# Patient Record
Sex: Male | Born: 1950 | Race: White | Hispanic: No | Smoking: Never smoker
Health system: Southern US, Community
[De-identification: ages and names within clinical notes are randomized; demographics above are authoritative.]

## PROBLEM LIST (undated history)

## (undated) DIAGNOSIS — F329 Major depressive disorder, single episode, unspecified: Secondary | ICD-10-CM

## (undated) DIAGNOSIS — I4819 Other persistent atrial fibrillation: Secondary | ICD-10-CM

## (undated) DIAGNOSIS — R972 Elevated prostate specific antigen [PSA]: Secondary | ICD-10-CM

## (undated) DIAGNOSIS — N529 Male erectile dysfunction, unspecified: Secondary | ICD-10-CM

## (undated) DIAGNOSIS — K219 Gastro-esophageal reflux disease without esophagitis: Secondary | ICD-10-CM

## (undated) DIAGNOSIS — M1990 Unspecified osteoarthritis, unspecified site: Secondary | ICD-10-CM

## (undated) DIAGNOSIS — J4520 Mild intermittent asthma, uncomplicated: Secondary | ICD-10-CM

## (undated) DIAGNOSIS — I10 Essential (primary) hypertension: Secondary | ICD-10-CM

## (undated) DIAGNOSIS — E785 Hyperlipidemia, unspecified: Secondary | ICD-10-CM

## (undated) DIAGNOSIS — R0602 Shortness of breath: Secondary | ICD-10-CM

## (undated) DIAGNOSIS — F419 Anxiety disorder, unspecified: Secondary | ICD-10-CM

## (undated) DIAGNOSIS — F32A Depression, unspecified: Secondary | ICD-10-CM

## (undated) DIAGNOSIS — R7303 Prediabetes: Secondary | ICD-10-CM

## (undated) HISTORY — DX: Prediabetes: R73.03

## (undated) HISTORY — DX: Hyperlipidemia, unspecified: E78.5

## (undated) HISTORY — DX: Mild intermittent asthma, uncomplicated: J45.20

## (undated) HISTORY — PX: TONSILLECTOMY: SUR1361

## (undated) HISTORY — DX: Elevated prostate specific antigen (PSA): R97.20

## (undated) HISTORY — PX: HERNIA REPAIR: SHX51

## (undated) HISTORY — DX: Male erectile dysfunction, unspecified: N52.9

## (undated) HISTORY — DX: Essential (primary) hypertension: I10

## (undated) HISTORY — DX: Other persistent atrial fibrillation: I48.19

## (undated) HISTORY — DX: Anxiety disorder, unspecified: F41.9

---

## 2007-11-08 ENCOUNTER — Encounter (INDEPENDENT_AMBULATORY_CARE_PROVIDER_SITE_OTHER): Payer: Self-pay | Admitting: *Deleted

## 2007-11-08 ENCOUNTER — Ambulatory Visit (HOSPITAL_COMMUNITY): Admission: RE | Admit: 2007-11-08 | Discharge: 2007-11-08 | Payer: Self-pay | Admitting: *Deleted

## 2010-05-31 ENCOUNTER — Other Ambulatory Visit: Payer: Self-pay | Admitting: Gastroenterology

## 2010-07-23 NOTE — Op Note (Signed)
Greg Huff, Greg Huff               ACCOUNT NO.:  [id number]   MEDICAL RECORD NO.:  [id number]          PATIENT TYPE:  AMB   LOCATION:  ENDO                         FACILITY:  [facility]   PHYSICIAN:  Georgiana Spinner, M.D.    DATE OF BIRTH:  March 26, 1950   DATE OF PROCEDURE:  DATE OF DISCHARGE:                               OPERATIVE REPORT   PROCEDURE:  Colonoscopy.   INDICATIONS:  Colon cancer screening.   ANESTHESIA:  Fentanyl 45 mcg, Versed 4 mg.   PROCEDURE:  With the patient mildly sedated in the left lateral  decubitus position, a rectal exam was performed which was unremarkable.  Subsequently, the Pentax videoscopic colonoscope was inserted in the  rectum and passed under direct vision to the cecum identified by  ileocecal valve and appendiceal orifice both of which were photographed.  There was a small polyp in the cecum near the appendiceal orifice, which  was photographed and removed using snare cautery technique setting of  20/150 blended current.  The endoscope was then slowly withdrawn taking  circumferential views of the remaining colonic mucosa, stopping in the  ascending colon a few folds removed from the ileocecal valve, at which  point another small polyp was seen.  It was removed using hot biopsy  forceps technique and added to the first bottle.  The remainder of the  tissue was eradicated.  We next stopped in the descending colon  approximately 30 cm from the anal verge, at which point a third polyp  was seen.  It too was photographed and it too was removed using hot  biopsy forceps technique same setting.  In the rectum, we placed the  endoscope in retroflexed view to view the anal canal from below.  Internal hemorrhoids were seen and photographed.  The endoscope was  straightened and withdrawn.  The patient's vital signs and pulse  oximeter remained stable.  The patient tolerated the procedure well  without apparent complication.   FINDINGS:  Small polyps of the  cecum and ascending colon and at 30 cm  from anal verge along with internal hemorrhoids.  Otherwise, an  unremarkable exam.   PLAN:  Await biopsy report.  The patient will call me for results and  follow up with me as an outpatient.           ______________________________  Georgiana Spinner, M.D.     GMO/MEDQ  D:  11/08/2007  T:  11/08/2007  Job:  [id number]

## 2010-07-23 NOTE — Op Note (Signed)
NAMEANASTACIO, BUA               ACCOUNT NO.:  [id number]   MEDICAL RECORD NO.:  [id number]          PATIENT TYPE:  AMB   LOCATION:  ENDO                         FACILITY:  [facility]   PHYSICIAN:  Georgiana Spinner, M.D.    DATE OF BIRTH:  September 16, 1950   DATE OF PROCEDURE:  DATE OF DISCHARGE:                               OPERATIVE REPORT   PROCEDURE:  Upper endoscopy.   INDICATIONS:  GERD.   ANESTHESIA:  Fentanyl 75 mcg, Versed 7 mg.   PROCEDURE:  With the patient mildly sedated in the left lateral  decubitus position the Pentax videoscopic endoscope was inserted in the  mouth, passed under direct vision through the esophagus which appeared  normal until we reached the distal esophagus and there appeared to be  two small islands of Barrett's, photographed and biopsied.  We entered  into the stomach, fundus, body, antrum, duodenal bulb, second portion of  the duodenum and all appeared normal.  From this point the endoscope was  slowly withdrawn taking circumferential views of duodenal mucosa until  the endoscope had been pulled back into stomach and placed in  retroflexion to view the stomach from below.  The endoscope was  straightened and withdrawn taking circumferential views of the remaining  gastric and esophageal mucosa.  The patient's vital signs and pulse  oximeter remained stable.  The patient tolerated the procedure well  without apparent complications.   FINDINGS:  Two small islands of Barrett's esophagus biopsied.  Await  biopsy report.  The patient will call me for results and follow-up with  me as an outpatient.  Proceed to colonoscopy as planned.           ______________________________  Georgiana Spinner, M.D.     GMO/MEDQ  D:  11/08/2007  T:  11/08/2007  Job:  [id number]

## 2012-03-12 ENCOUNTER — Other Ambulatory Visit: Payer: Self-pay | Admitting: Orthopedic Surgery

## 2012-03-12 MED ORDER — DEXAMETHASONE SODIUM PHOSPHATE 10 MG/ML IJ SOLN: 10.0000 mg | Freq: Once | INTRAMUSCULAR | Status: DC

## 2012-03-12 MED ORDER — BUPIVACAINE LIPOSOME 1.3 % IJ SUSP: 20.0000 mL | Freq: Once | INTRAMUSCULAR | Status: DC

## 2012-03-12 NOTE — Progress Notes (Signed)
Preoperative surgical orders have been place into the Epic hospital system for Greg Huff on 03/12/2012, 1:53 PM  by Patrica Duel for surgery on 04/21/2012.  Preop Total Hip - Anterior Approach orders including Experel Injecion, IV Tylenol, and IV Decadron as long as there are no contraindications to the above medications. Avel Peace, PA-C

## 2012-04-13 NOTE — Patient Instructions (Signed)
Greg Huff  04/13/2012   Your procedure is scheduled on:  04/21/12   Report to [id number]  AM.  Call this number if you have problems the morning of surgery: [phone]   Remember:   Do not eat food or drink liquids after midnight.   Take these medicines the morning of surgery with A SIP OF WATER:    Do not wear jewelry,   Do not wear lotions, powders, or perfumes.   . Men may shave face and neck.  Do not bring valuables to the hospital.  Contacts, dentures or bridgework may not be worn into surgery.  Leave suitcase in the car. After surgery it may be brought to your room.  For patients admitted to the hospital, checkout time is 11:00 AM the day of  discharge.        SEE CHG INSTRUCTION SHEET    Please read over the following fact sheets that you were given: MRSA Information, coughing and deep breathing exercises, leg exercises, Blood Transfusion Fact sheet, Incentive Spirometry Fact sheet                Failure to comply with these instructions may result in cancellation of your surgery.                Patient Signature ____________________________              Nurse Signature _____________________________

## 2012-04-14 ENCOUNTER — Ambulatory Visit (HOSPITAL_COMMUNITY)
Admission: RE | Admit: 2012-04-14 | Discharge: 2012-04-14 | Disposition: A | Discharge status: Home / Self Care | Payer: BC Managed Care – PPO | Source: Ambulatory Visit | Attending: Orthopedic Surgery | Admitting: Orthopedic Surgery

## 2012-04-14 ENCOUNTER — Encounter (HOSPITAL_COMMUNITY)
Admission: RE | Admit: 2012-04-14 | Discharge: 2012-04-14 | Disposition: A | Discharge status: Home / Self Care | Payer: BC Managed Care – PPO | Source: Ambulatory Visit | Attending: Orthopedic Surgery | Admitting: Orthopedic Surgery

## 2012-04-14 ENCOUNTER — Encounter (HOSPITAL_COMMUNITY): Payer: Self-pay

## 2012-04-14 DIAGNOSIS — M169 Osteoarthritis of hip, unspecified: Secondary | ICD-10-CM | POA: Insufficient documentation

## 2012-04-14 DIAGNOSIS — M1610 Unilateral primary osteoarthritis, unspecified hip: Secondary | ICD-10-CM | POA: Insufficient documentation

## 2012-04-14 DIAGNOSIS — Z01812 Encounter for preprocedural laboratory examination: Secondary | ICD-10-CM | POA: Insufficient documentation

## 2012-04-14 DIAGNOSIS — Z01818 Encounter for other preprocedural examination: Secondary | ICD-10-CM | POA: Insufficient documentation

## 2012-04-14 HISTORY — DX: Major depressive disorder, single episode, unspecified: F32.9

## 2012-04-14 HISTORY — DX: Shortness of breath: R06.02

## 2012-04-14 HISTORY — DX: Unspecified osteoarthritis, unspecified site: M19.90

## 2012-04-14 HISTORY — DX: Depression, unspecified: F32.A

## 2012-04-14 HISTORY — DX: Gastro-esophageal reflux disease without esophagitis: K21.9

## 2012-04-14 LAB — URINALYSIS, ROUTINE W REFLEX MICROSCOPIC
Glucose, UA: NEGATIVE mg/dL
Leukocytes, UA: NEGATIVE
Nitrite: NEGATIVE
Specific Gravity, Urine: 1.022 (ref 1.005–1.030)
pH: 5.5 (ref 5.0–8.0)

## 2012-04-14 LAB — CBC
MCH: 32.5 pg (ref 26.0–34.0)
MCV: 93.5 fL (ref 78.0–100.0)
Platelets: 238 10*3/uL (ref 150–400)
RBC: 4.31 MIL/uL (ref 4.22–5.81)
RDW: 12.7 % (ref 11.5–15.5)
WBC: 7.2 10*3/uL (ref 4.0–10.5)

## 2012-04-14 LAB — COMPREHENSIVE METABOLIC PANEL
ALT: 28 U/L (ref 0–53)
AST: 25 U/L (ref 0–37)
Albumin: 3.8 g/dL (ref 3.5–5.2)
Calcium: 9.2 mg/dL (ref 8.4–10.5)
Chloride: 106 mEq/L (ref 96–112)
Creatinine, Ser: 0.73 mg/dL (ref 0.50–1.35)
Sodium: 141 mEq/L (ref 135–145)

## 2012-04-14 LAB — SURGICAL PCR SCREEN
MRSA, PCR: NEGATIVE
Staphylococcus aureus: NEGATIVE

## 2012-04-14 LAB — PROTIME-INR: INR: 1.27 (ref 0.00–1.49)

## 2012-04-14 NOTE — Progress Notes (Signed)
Urinalysis results faxed via EPIC to Dr Lequita Halt.

## 2012-04-14 NOTE — Progress Notes (Signed)
Your patient has screened at an elevated risk for Obstructive Sleep Apnea using the STOP-Bang Tool during a presurgical visit.  A score of 4 or greater is considered an elevated risk.   

## 2012-04-14 NOTE — Progress Notes (Signed)
04/14/12 0919  OBSTRUCTIVE SLEEP APNEA  Have you ever been diagnosed with sleep apnea through a sleep study? No  Do you snore loudly (loud enough to be heard through closed doors)?  0  Do you often feel tired, fatigued, or sleepy during the daytime? 1  Has anyone observed you stop breathing during your sleep? 0  Do you have, or are you being treated for high blood pressure? 1  BMI more than 35 kg/m2? 0  Age over 60 years old? 1  Neck circumference greater than 40 cm/18 inches? 0  Gender: 1  Obstructive Sleep Apnea Score 4

## 2012-04-14 NOTE — Progress Notes (Signed)
Last office visit note with Dr Herbie Baltimore ( cardiology ) 12/31/11 on chart  EKG 12/31/11 on chart Stress Test 07/29/11 on chart  ECHO 06/02/11 on chart  CPET- 12/22/11 on chart  PFT- 12/22/11 on chart

## 2012-04-18 ENCOUNTER — Other Ambulatory Visit: Payer: Self-pay | Admitting: Orthopedic Surgery

## 2012-04-18 NOTE — H&P (Signed)
Greg Huff  DOB: 06/15/1950 Divorced / Language: English / Race: White Male  Date of Admission:  04/21/2012  Chief Complaint:  Left Hip Pain  History of Present Illness The patient is a 61 year old male who comes in for a preoperative History and Physical. The patient is scheduled for a left to be performed by Dr. Frank V. Aluisio, MD at [facility] on 04/21/2012. The patient is a 61 year old male who presents with a hip problem. The patient reports left hip and right hip problems including pain symptoms that have been present for 1 year(s). The symptoms began without any known injury.The patient feels as if their symptoms are does feel they are worsening. Prior to being seen today the patient was previously evaluated in this clinic by Dr. Gioffre 6 months ago. Previous workup for this problem has included hip x-rays. He reports the left hip is much worse than the right. He has pain in the groin and into the anterior thigh. No numbness or tingling. The right hip does not really bother him at all. He was told that he had some arthritic changes in his right hip but he does not notice it. The left hip has been bothering him for about a year. Dr. Gioffre recommended Celebrex if cleared by his cardiologist but it was not. He does take Tramadol but does not notice a big difference. He has had two intra-articular injections by Dr. Beekman. These last him about 2 months. His last one was two months ago. He is ready to discuss total hip. Unfortunately, his hip is getting progressively worse over time. He has pain at most times and with almost all activities. He used to be a runner but stopped doing that a few years ago. He even has pain with just walking now. He can sleep at night but does take awhile to get in a comfortable position. He's having increased functional limitations due to the hip.  He is ready to proceed with hip surgery. They have been treated conservatively in the past  for the above stated problem and despite conservative measures, they continue to have progressive pain and severe functional limitations and dysfunction. They have failed non-operative management including home exercise, medications. It is felt that they would benefit from undergoing total joint replacement. Risks and benefits of the procedure have been discussed with the patient and they elect to proceed with surgery. There are no active contraindications to surgery such as ongoing infection or rapidly progressive neurological disease.   Problem List Osteoarthritis, hip (715.35)   Allergies No Known Drug Allergies   Family History Cerebrovascular Accident. mother Congestive Heart Failure. grandmother mothers side Heart disease in male family member before age 65 Hypertension. mother and grandmother mothers side   Social History Living situation. live with partner Marital status. partnered Most recent primary occupation. assistant superintendent for [employer] Drug[facility]cohol [facility] (Currently). no Exercise. Exercises daily; does running / walking and gym / weights Illicit drug use. no Number of flights of stairs before winded. greater than 5 Tobacco use. never smoker Pain Contract. no Previously in rehab. no Tobacco / smoke exposure. no Current work status. working full time Alcohol use. current drinker; drinks beer, wine and hard liquor; 8-14 per week Children. 2 Post-Surgical Plans. Plan is to go home. Advance Directives. None   Medication History Omeprazole (20MG Tablet DR, Oral) Active. AndroGel Pump (20.25 MG/ACT(1.62%) Gel, Transdermal) Active. Vitamin B Complex ( Oral) Active. Metoprolol Tartrate (25MG Tablet,   Oral) Active. Atorvastatin Calcium (20MG Tablet, Oral) Active. Sertraline HCl (50MG Tablet, Oral) Active. Xarelto (20MG Tablet, Oral) Active. Aspirin Child (81MG Tablet Chewable, Oral) Active. Lisinopril  (20MG Tablet, Oral) Active. Cialis ( Oral) Specific dose unknown - Active. TraMADol HCl (50MG Tablet, Oral) Active.   Past Surgical History Colon Polyp Removal - Colonoscopy Other Surgery. Date: 11/2005. Umbilical hernia Vasectomy   Medical History High blood pressure Gastroesophageal Reflux Disease Atrial Fibrillation Shingles Bronchitis Pneumonia Barrett's Esophagus. Past History - controlled with current medications. Eczema   Review of Systems General:Not Present- Chills, Fever, Night Sweats, Fatigue, Weight Gain, Weight Loss and Memory Loss. Skin:Not Present- Hives, Itching, Rash, Eczema and Lesions. HEENT:Not Present- Tinnitus, Headache, Double Vision, Visual Loss, Hearing Loss and Dentures. Respiratory:Not Present- Shortness of breath with exertion, Shortness of breath at rest, Allergies, Coughing up blood and Chronic Cough. Cardiovascular:Not Present- Chest Pain, Racing/skipping heartbeats, Difficulty Breathing Lying Down, Murmur, Swelling and Palpitations. Gastrointestinal:Present- Heartburn. Not Present- Bloody Stool, Abdominal Pain, Vomiting, Nausea, Constipation, Diarrhea, Difficulty Swallowing, Jaundice and Loss of appetitie. Male Genitourinary:Not Present- Urinary frequency, Blood in Urine, Weak urinary stream, Discharge, Flank Pain, Incontinence, Painful Urination, Urgency, Urinary Retention and Urinating at Night. Musculoskeletal:Present- Joint Pain. Not Present- Muscle Weakness, Muscle Pain, Joint Swelling, Back Pain, Morning Stiffness and Spasms. Neurological:Not Present- Tremor, Dizziness, Blackout spells, Paralysis, Difficulty with balance and Weakness. Psychiatric:Not Present- Insomnia.   Vitals Weight: 220 lb Height: 73 in Body Surface Area: 2.27 m Body Mass Index: 29.03 kg/m Pulse: 76 (Irregular) Resp.: 14 (Unlabored) BP: 142/88 (Sitting, Right Arm, Standard)    Physical Exam The physical exam findings are as  follows:  Note: Patient is a 61 year old male with continued hip pain.   General Mental Status - Alert, cooperative and good historian. General Appearance- pleasant. Not in acute distress. Orientation- Oriented X3. Build & Nutrition- Well nourished and Well developed.   Head and Neck Head- normocephalic, atraumatic . Neck Global Assessment- supple. no bruit auscultated on the right and no bruit auscultated on the left.   Eye Pupil- Bilateral- Regular and Round. Motion- Bilateral- EOMI.   Chest and Lung Exam Auscultation: Breath sounds:- clear at anterior chest wall and - clear at posterior chest wall. Adventitious sounds:- No Adventitious sounds.   Cardiovascular Auscultation:Rhythm- Regular rate and rhythm. Heart Sounds- S1 WNL and S2 WNL. Murmurs & Other Heart Sounds:Auscultation of the heart reveals - No Murmurs.   Abdomen Palpation/Percussion:Tenderness- Abdomen is non-tender to palpation. Rigidity (guarding)- Abdomen is soft. Auscultation:Auscultation of the abdomen reveals - Bowel sounds normal.   Male Genitourinary Not done, not pertinent to present illness  Musculoskeletal On exam, he is a well-developed male, alert and oriented, in no apparent distress. His right hip can be flexed to 110, rotated in 20, out 30, abducted 30 without discomfort. Left hip flexion 90. Minimal internal rotation, about 10 external rotation, about 30 abduction. He does have pain on rotation and on abduction.  Knee exam is normal. Pulses, sensation and motor are intact, both lower extremities. He walks with a slightly antalgic gait pattern, left leg.  RADIOGRAPHS: AP pelvis and lateral of the left hip taken today show bone on bone arthritis of the left hip. He has a shallow acetabulum with some dysplasia present.  Assessment & Plan Osteoarthritis, hip (715.35) Impression: Left Hip  Note: Patient is for a Left Total Hip Replacement by Dr.  Aluisio.  Plan is to go home.  PCP - Dr. Walter Pharr - Patient has been seen preoperatively and felt to   be stable for surgery. Cardiology - Dr. Harding - Patient has been seen preoperatively and felt to be stable for surgery. He is instructed to stop his ASA 5 days preop and his Xarelto 3 dayas preop. We will resume both medications postoperatively.  Signed electronically by DREW L Adyn Serna, PA-C  

## 2012-04-19 ENCOUNTER — Encounter (HOSPITAL_COMMUNITY): Payer: Self-pay | Admitting: Pharmacy Technician

## 2012-04-21 ENCOUNTER — Ambulatory Visit (HOSPITAL_COMMUNITY): Payer: BC Managed Care – PPO | Admitting: Anesthesiology

## 2012-04-21 ENCOUNTER — Ambulatory Visit (HOSPITAL_COMMUNITY): Payer: BC Managed Care – PPO

## 2012-04-21 ENCOUNTER — Inpatient Hospital Stay (HOSPITAL_COMMUNITY)
Admission: RE | Admit: 2012-04-21 | Discharge: 2012-04-23 | DRG: 818 | Disposition: A | Discharge status: Home Health | Payer: BC Managed Care – PPO | Source: Ambulatory Visit | Attending: Orthopedic Surgery | Admitting: Orthopedic Surgery

## 2012-04-21 ENCOUNTER — Encounter (HOSPITAL_COMMUNITY)
Admission: RE | Disposition: A | Discharge status: Home Health | Payer: Self-pay | Source: Ambulatory Visit | Attending: Orthopedic Surgery

## 2012-04-21 ENCOUNTER — Encounter (HOSPITAL_COMMUNITY): Payer: Self-pay | Admitting: Anesthesiology

## 2012-04-21 ENCOUNTER — Encounter (HOSPITAL_COMMUNITY): Payer: Self-pay | Admitting: *Deleted

## 2012-04-21 DIAGNOSIS — Z96649 Presence of unspecified artificial hip joint: Secondary | ICD-10-CM

## 2012-04-21 DIAGNOSIS — I4891 Unspecified atrial fibrillation: Secondary | ICD-10-CM | POA: Diagnosis present

## 2012-04-21 DIAGNOSIS — M169 Osteoarthritis of hip, unspecified: Secondary | ICD-10-CM | POA: Diagnosis present

## 2012-04-21 DIAGNOSIS — M1610 Unilateral primary osteoarthritis, unspecified hip: Principal | ICD-10-CM | POA: Diagnosis present

## 2012-04-21 DIAGNOSIS — I10 Essential (primary) hypertension: Secondary | ICD-10-CM | POA: Diagnosis present

## 2012-04-21 DIAGNOSIS — K219 Gastro-esophageal reflux disease without esophagitis: Secondary | ICD-10-CM | POA: Diagnosis present

## 2012-04-21 DIAGNOSIS — E669 Obesity, unspecified: Secondary | ICD-10-CM | POA: Diagnosis present

## 2012-04-21 HISTORY — PX: TOTAL HIP ARTHROPLASTY: SHX124

## 2012-04-21 LAB — ABO/RH: ABO/RH(D): B POS

## 2012-04-21 LAB — TYPE AND SCREEN: Antibody Screen: NEGATIVE

## 2012-04-21 SURGERY — ARTHROPLASTY, HIP, TOTAL, ANTERIOR APPROACH
Anesthesia: General | Site: Hip | Laterality: Left | Wound class: Clean

## 2012-04-21 MED ORDER — DEXAMETHASONE SODIUM PHOSPHATE 10 MG/ML IJ SOLN: 10.0000 mg | Freq: Once | INTRAMUSCULAR | Status: AC

## 2012-04-21 MED ORDER — OXYCODONE HCL 5 MG PO TABS
5.0000 mg | ORAL_TABLET | ORAL | Status: DC | PRN
  Administered 2012-04-21 – 2012-04-23 (×12): 10 mg via ORAL
  Filled 2012-04-21 (×12): qty 2

## 2012-04-21 MED ORDER — METOPROLOL TARTRATE 25 MG PO TABS
25.0000 mg | ORAL_TABLET | Freq: Two times a day (BID) | ORAL | Status: DC
  Administered 2012-04-22 – 2012-04-23 (×4): 25 mg via ORAL
  Filled 2012-04-21 (×5): qty 1

## 2012-04-21 MED ORDER — PANTOPRAZOLE SODIUM 40 MG PO TBEC
40.0000 mg | DELAYED_RELEASE_TABLET | Freq: Every day | ORAL | Status: DC
  Filled 2012-04-21: qty 1

## 2012-04-21 MED ORDER — PROPOFOL 10 MG/ML IV EMUL
INTRAVENOUS | Status: DC | PRN
  Administered 2012-04-21: 100 ug/kg/min via INTRAVENOUS

## 2012-04-21 MED ORDER — SODIUM CHLORIDE 0.9 % IJ SOLN
INTRAMUSCULAR | Status: DC | PRN
  Administered 2012-04-21: 50 mL via INTRAVENOUS

## 2012-04-21 MED ORDER — CEFAZOLIN SODIUM-DEXTROSE 2-3 GM-% IV SOLR
2.0000 g | Freq: Four times a day (QID) | INTRAVENOUS | Status: AC
  Administered 2012-04-21 (×2): 2 g via INTRAVENOUS
  Filled 2012-04-21 (×2): qty 50

## 2012-04-21 MED ORDER — ACETAMINOPHEN 10 MG/ML IV SOLN
INTRAVENOUS | Status: AC
  Filled 2012-04-21: qty 100

## 2012-04-21 MED ORDER — ACETAMINOPHEN 650 MG RE SUPP: 650.0000 mg | Freq: Four times a day (QID) | RECTAL | Status: DC | PRN

## 2012-04-21 MED ORDER — ONDANSETRON HCL 4 MG/2ML IJ SOLN
INTRAMUSCULAR | Status: DC | PRN
  Administered 2012-04-21: 4 mg via INTRAVENOUS

## 2012-04-21 MED ORDER — CEFAZOLIN SODIUM-DEXTROSE 2-3 GM-% IV SOLR
INTRAVENOUS | Status: AC
  Filled 2012-04-21: qty 50

## 2012-04-21 MED ORDER — POLYETHYLENE GLYCOL 3350 17 G PO PACK: 17.0000 g | PACK | Freq: Every day | ORAL | Status: DC | PRN

## 2012-04-21 MED ORDER — LACTATED RINGERS IV SOLN: INTRAVENOUS | Status: DC

## 2012-04-21 MED ORDER — LACTATED RINGERS IV SOLN
INTRAVENOUS | Status: DC | PRN
  Administered 2012-04-21 (×3): via INTRAVENOUS

## 2012-04-21 MED ORDER — ACETAMINOPHEN 10 MG/ML IV SOLN
1000.0000 mg | Freq: Once | INTRAVENOUS | Status: AC
  Administered 2012-04-21: 1000 mg via INTRAVENOUS

## 2012-04-21 MED ORDER — SODIUM CHLORIDE 0.9 % IV SOLN: INTRAVENOUS | Status: DC

## 2012-04-21 MED ORDER — ONDANSETRON HCL 4 MG/2ML IJ SOLN: 4.0000 mg | Freq: Four times a day (QID) | INTRAMUSCULAR | Status: DC | PRN

## 2012-04-21 MED ORDER — METHOCARBAMOL 100 MG/ML IJ SOLN
500.0000 mg | Freq: Four times a day (QID) | INTRAVENOUS | Status: DC | PRN
  Administered 2012-04-21: 500 mg via INTRAVENOUS
  Filled 2012-04-21: qty 5

## 2012-04-21 MED ORDER — ONDANSETRON HCL 4 MG PO TABS: 4.0000 mg | ORAL_TABLET | Freq: Four times a day (QID) | ORAL | Status: DC | PRN

## 2012-04-21 MED ORDER — METOCLOPRAMIDE HCL 10 MG PO TABS: 5.0000 mg | ORAL_TABLET | Freq: Three times a day (TID) | ORAL | Status: DC | PRN

## 2012-04-21 MED ORDER — BUPIVACAINE LIPOSOME 1.3 % IJ SUSP
20.0000 mL | Freq: Once | INTRAMUSCULAR | Status: AC
  Administered 2012-04-21: 20 mL
  Filled 2012-04-21: qty 20

## 2012-04-21 MED ORDER — STERILE WATER FOR IRRIGATION IR SOLN
Status: DC | PRN
  Administered 2012-04-21: 3000 mL

## 2012-04-21 MED ORDER — DEXTROSE-NACL 5-0.9 % IV SOLN
INTRAVENOUS | Status: DC
  Administered 2012-04-21 – 2012-04-22 (×2): via INTRAVENOUS

## 2012-04-21 MED ORDER — EPHEDRINE SULFATE 50 MG/ML IJ SOLN
INTRAMUSCULAR | Status: DC | PRN
  Administered 2012-04-21: 10 mg via INTRAVENOUS
  Administered 2012-04-21 (×3): 5 mg via INTRAVENOUS

## 2012-04-21 MED ORDER — BISACODYL 10 MG RE SUPP: 10.0000 mg | Freq: Every day | RECTAL | Status: DC | PRN

## 2012-04-21 MED ORDER — DOCUSATE SODIUM 100 MG PO CAPS
100.0000 mg | ORAL_CAPSULE | Freq: Two times a day (BID) | ORAL | Status: DC
  Administered 2012-04-22 – 2012-04-23 (×4): 100 mg via ORAL

## 2012-04-21 MED ORDER — METOCLOPRAMIDE HCL 5 MG/ML IJ SOLN: 5.0000 mg | Freq: Three times a day (TID) | INTRAMUSCULAR | Status: DC | PRN

## 2012-04-21 MED ORDER — PHENOL 1.4 % MT LIQD: 1.0000 | OROMUCOSAL | Status: DC | PRN

## 2012-04-21 MED ORDER — RIVAROXABAN 10 MG PO TABS
10.0000 mg | ORAL_TABLET | Freq: Every day | ORAL | Status: DC
  Administered 2012-04-22 – 2012-04-23 (×2): 10 mg via ORAL
  Filled 2012-04-21 (×3): qty 1

## 2012-04-21 MED ORDER — ACETAMINOPHEN 325 MG PO TABS: 650.0000 mg | ORAL_TABLET | Freq: Four times a day (QID) | ORAL | Status: DC | PRN

## 2012-04-21 MED ORDER — HYDROMORPHONE HCL PF 1 MG/ML IJ SOLN: 0.2500 mg | INTRAMUSCULAR | Status: DC | PRN

## 2012-04-21 MED ORDER — FENTANYL CITRATE 0.05 MG/ML IJ SOLN
INTRAMUSCULAR | Status: DC | PRN
Start: 2012-04-21 — End: 2012-04-21
  Administered 2012-04-21: 100 ug via INTRAVENOUS

## 2012-04-21 MED ORDER — BUPIVACAINE HCL (PF) 0.5 % IJ SOLN
INTRAMUSCULAR | Status: AC
  Filled 2012-04-21: qty 30

## 2012-04-21 MED ORDER — ACETAMINOPHEN 10 MG/ML IV SOLN
1000.0000 mg | Freq: Four times a day (QID) | INTRAVENOUS | Status: AC
  Administered 2012-04-21 – 2012-04-22 (×4): 1000 mg via INTRAVENOUS
  Filled 2012-04-21 (×6): qty 100

## 2012-04-21 MED ORDER — MORPHINE SULFATE 2 MG/ML IJ SOLN
1.0000 mg | INTRAMUSCULAR | Status: DC | PRN
  Administered 2012-04-21: 1 mg via INTRAVENOUS
  Administered 2012-04-21: 2 mg via INTRAVENOUS
  Administered 2012-04-21: 1 mg via INTRAVENOUS
  Filled 2012-04-21 (×3): qty 1

## 2012-04-21 MED ORDER — TRAMADOL HCL 50 MG PO TABS
50.0000 mg | ORAL_TABLET | Freq: Four times a day (QID) | ORAL | Status: DC | PRN
Start: 2012-04-21 — End: 2012-04-23

## 2012-04-21 MED ORDER — ATORVASTATIN CALCIUM 20 MG PO TABS
20.0000 mg | ORAL_TABLET | Freq: Every day | ORAL | Status: DC
  Administered 2012-04-21 – 2012-04-22 (×2): 20 mg via ORAL
  Filled 2012-04-21 (×3): qty 1

## 2012-04-21 MED ORDER — FLEET ENEMA 7-19 GM/118ML RE ENEM: 1.0000 | ENEMA | Freq: Once | RECTAL | Status: AC | PRN

## 2012-04-21 MED ORDER — MIDAZOLAM HCL 5 MG/5ML IJ SOLN
INTRAMUSCULAR | Status: DC | PRN
  Administered 2012-04-21 (×2): 1 mg via INTRAVENOUS
  Administered 2012-04-21: 2 mg via INTRAVENOUS

## 2012-04-21 MED ORDER — PROMETHAZINE HCL 25 MG/ML IJ SOLN: 6.2500 mg | INTRAMUSCULAR | Status: DC | PRN

## 2012-04-21 MED ORDER — 0.9 % SODIUM CHLORIDE (POUR BTL) OPTIME
TOPICAL | Status: DC | PRN
  Administered 2012-04-21: 1000 mL

## 2012-04-21 MED ORDER — CEFAZOLIN SODIUM-DEXTROSE 2-3 GM-% IV SOLR
2.0000 g | INTRAVENOUS | Status: AC
  Administered 2012-04-21: 2 g via INTRAVENOUS

## 2012-04-21 MED ORDER — CHLORHEXIDINE GLUCONATE 4 % EX LIQD
60.0000 mL | Freq: Once | CUTANEOUS | Status: DC
  Filled 2012-04-21: qty 60

## 2012-04-21 MED ORDER — DEXAMETHASONE 6 MG PO TABS
10.0000 mg | ORAL_TABLET | Freq: Once | ORAL | Status: AC
  Administered 2012-04-22: 10 mg via ORAL
  Filled 2012-04-21: qty 1

## 2012-04-21 MED ORDER — DIPHENHYDRAMINE HCL 12.5 MG/5ML PO ELIX
12.5000 mg | ORAL_SOLUTION | ORAL | Status: DC | PRN
  Administered 2012-04-22 (×3): 12.5 mg via ORAL
  Administered 2012-04-23: 25 mg via ORAL
  Filled 2012-04-21 (×2): qty 5
  Filled 2012-04-21 (×2): qty 10

## 2012-04-21 MED ORDER — METHOCARBAMOL 500 MG PO TABS
500.0000 mg | ORAL_TABLET | Freq: Four times a day (QID) | ORAL | Status: DC | PRN
  Administered 2012-04-22 – 2012-04-23 (×3): 500 mg via ORAL
  Filled 2012-04-21 (×3): qty 1

## 2012-04-21 MED ORDER — MENTHOL 3 MG MT LOZG: 1.0000 | LOZENGE | OROMUCOSAL | Status: DC | PRN

## 2012-04-21 MED ORDER — SERTRALINE HCL 50 MG PO TABS
50.0000 mg | ORAL_TABLET | Freq: Every morning | ORAL | Status: DC
  Administered 2012-04-21 – 2012-04-23 (×3): 50 mg via ORAL
  Filled 2012-04-21 (×3): qty 1

## 2012-04-21 SURGICAL SUPPLY — 42 items
BAG ZIPLOCK 12X15 (MISCELLANEOUS) ×4 IMPLANT
BLADE SAW SGTL 18X1.27X75 (BLADE) ×2 IMPLANT
CLOTH BEACON ORANGE TIMEOUT ST (SAFETY) ×2 IMPLANT
CLSR STERI-STRIP ANTIMIC 1/2X4 (GAUZE/BANDAGES/DRESSINGS) ×4 IMPLANT
DECANTER SPIKE VIAL GLASS SM (MISCELLANEOUS) ×2 IMPLANT
DRAPE C-ARM 42X72 X-RAY (DRAPES) ×2 IMPLANT
DRAPE STERI IOBAN 125X83 (DRAPES) ×2 IMPLANT
DRAPE U-SHAPE 47X51 STRL (DRAPES) ×6 IMPLANT
DRSG ADAPTIC 3X8 NADH LF (GAUZE/BANDAGES/DRESSINGS) ×2 IMPLANT
DRSG MEPILEX BORDER 4X4 (GAUZE/BANDAGES/DRESSINGS) ×4 IMPLANT
DRSG MEPILEX BORDER 4X8 (GAUZE/BANDAGES/DRESSINGS) ×2 IMPLANT
DURAPREP 26ML APPLICATOR (WOUND CARE) ×4 IMPLANT
ELECT BLADE 6.5 EXT (BLADE) ×2 IMPLANT
ELECT REM PT RETURN 9FT ADLT (ELECTROSURGICAL) ×2
ELECTRODE REM PT RTRN 9FT ADLT (ELECTROSURGICAL) ×1 IMPLANT
EVACUATOR 1/8 PVC DRAIN (DRAIN) ×2 IMPLANT
FACESHIELD LNG OPTICON STERILE (SAFETY) ×8 IMPLANT
GLOVE BIO SURGEON STRL SZ7.5 (GLOVE) ×2 IMPLANT
GLOVE BIO SURGEON STRL SZ8 (GLOVE) ×4 IMPLANT
GLOVE BIOGEL PI IND STRL 8 (GLOVE) ×1 IMPLANT
GLOVE BIOGEL PI INDICATOR 8 (GLOVE) ×1
GOWN BRE IMP PREV XXLGXLNG (GOWN DISPOSABLE) ×4 IMPLANT
GOWN SRG XL XLNG 56XLVL 4 (GOWN DISPOSABLE) ×1 IMPLANT
GOWN STRL NON-REIN LRG LVL3 (GOWN DISPOSABLE) ×2 IMPLANT
GOWN STRL NON-REIN XL XLG LVL4 (GOWN DISPOSABLE) ×1
KIT BASIN OR (CUSTOM PROCEDURE TRAY) ×2 IMPLANT
NDL SAFETY ECLIPSE 18X1.5 (NEEDLE) ×1 IMPLANT
NEEDLE HYPO 18GX1.5 SHARP (NEEDLE) ×1
PACK TOTAL JOINT (CUSTOM PROCEDURE TRAY) ×2 IMPLANT
PADDING CAST COTTON 6X4 STRL (CAST SUPPLIES) ×2 IMPLANT
SPONGE GAUZE 4X4 12PLY (GAUZE/BANDAGES/DRESSINGS) ×2 IMPLANT
SUCTION FRAZIER 12FR DISP (SUCTIONS) ×2 IMPLANT
SUT ETHIBOND NAB CT1 #1 30IN (SUTURE) ×6 IMPLANT
SUT MNCRL AB 4-0 PS2 18 (SUTURE) ×2 IMPLANT
SUT VIC AB 1 CT1 27 (SUTURE) ×1
SUT VIC AB 1 CT1 27XBRD ANTBC (SUTURE) ×1 IMPLANT
SUT VIC AB 2-0 CT1 27 (SUTURE) ×3
SUT VIC AB 2-0 CT1 TAPERPNT 27 (SUTURE) ×3 IMPLANT
SUT VLOC 180 0 24IN GS25 (SUTURE) ×2 IMPLANT
SYR 50ML LL SCALE MARK (SYRINGE) ×2 IMPLANT
TOWEL OR 17X26 10 PK STRL BLUE (TOWEL DISPOSABLE) ×4 IMPLANT
TRAY FOLEY CATH 14FRSI W/METER (CATHETERS) ×2 IMPLANT

## 2012-04-21 NOTE — Anesthesia Postprocedure Evaluation (Signed)
  Anesthesia Post-op Note  Patient: Greg Huff  Procedure(s) Performed: Procedure(s) (LRB): TOTAL HIP ARTHROPLASTY ANTERIOR APPROACH (Left)  Patient Location: PACU  Anesthesia Type: Spinal  Level of Consciousness: awake and alert   Airway and Oxygen Therapy: Patient Spontanous Breathing  Post-op Pain: mild  Post-op Assessment: Post-op Vital signs reviewed, Patient's Cardiovascular Status Stable, Respiratory Function Stable, Patent Airway and No signs of Nausea or vomiting  Last Vitals:  Filed Vitals:   04/21/12 1130  BP: 136/88  Pulse: 70  Temp: 36.3 C  Resp: 12    Post-op Vital Signs: stable   Complications: No apparent anesthesia complications. Moving feet bilaterally. No back pain. Feels fine. Xarelto tomorrow morning.

## 2012-04-21 NOTE — Op Note (Signed)
OPERATIVE REPORT  PREOPERATIVE DIAGNOSIS: Osteoarthritis of the Left hip.   POSTOPERATIVE DIAGNOSIS: Osteoarthritis of the Left  hip.   PROCEDURE: Left total hip arthroplasty, anterior approach.   SURGEON: Ollen Gross, MD   ASSISTANT: Avel Peace, PA-C  ANESTHESIA:  Spinal  ESTIMATED BLOOD LOSS:-200 ml   DRAINS: Hemovac x1.   COMPLICATIONS: None   CONDITION: PACU - hemodynamically stable.   BRIEF CLINICAL NOTE: Greg Huff is a 62 y.o. male who has advanced end-  stage arthritis of his Left  hip with progressively worsening pain and  dysfunction.The patient has failed nonoperative management and presents for  total hip arthroplasty.   PROCEDURE IN DETAIL: After successful administration of spinal  anesthetic, the traction boots for the [facility] bed were placed on both  feet and the patient was placed onto the [facility] bed, boots placed into the leg  holders. The Left hip was then isolated from the perineum with plastic  drapes and prepped and draped in the usual sterile fashion. ASIS and  greater trochanter were marked and a oblique incision was made, starting  at about 1 cm lateral and 2 cm distal to the ASIS and coursing towards  the anterior cortex of the femur. The skin was cut with a 10 blade  through subcutaneous tissue to the level of the fascia overlying the  tensor fascia lata muscle. The fascia was then incised in line with the  incision at the junction of the anterior third and posterior 2/3rd. The  muscle was teased off the fascia and then the interval between the TFL  and the rectus was developed. The Hohmann retractor was then placed at  the top of the femoral neck over the capsule. The vessels overlying the  capsule were cauterized and the fat on top of the capsule was removed.  A Hohmann retractor was then placed anterior underneath the rectus  femoris to give exposure to the entire anterior capsule. A T-shaped  capsulotomy was performed. The  edges were tagged and the femoral head  was identified.       Osteophytes are removed off the superior acetabulum.  The femoral neck was then cut in situ with an oscillating saw. Traction  was then applied to the left lower extremity utilizing the [facility]  traction. The femoral head was then removed. Retractors were placed  around the acetabulum and then circumferential removal of the labrum was  performed. Osteophytes were also removed. Reaming starts at 47 mm to  medialize and  Increased in 2 mm increments to 55 mm. We reamed in  approximately 40 degrees of abduction, 20 degrees anteversion. A 56 mm  pinnacle acetabular shell was then impacted in anatomic position under  fluoroscopic guidance with excellent purchase. We did not need to place  any additional dome screws. A 36 mm neutral + 4 marathon liner was then  placed into the acetabular shell.       The femoral lift was then placed along the lateral aspect of the femur  just distal to the vastus ridge. The leg was  externally rotated and capsule  was stripped off the inferior aspect of the femoral neck down to the  level of the lesser trochanter, this was done with electrocautery. The femur was lifted after this was performed. The  leg was then placed and extended in adducted position to essentially delivering the femur. We also removed the capsule superiorly and the  piriformis from the piriformis  fossa to gain excellent exposure of the  proximal femur. Rongeur was used to remove some cancellous bone to get  into the lateral portion of the proximal femur for placement of the  initial starter reamer. The starter broaches was placed  the starter broach  and was shown to go down the center of the canal. Broaching  with the  Corail system was then performed starting at size 8, coursing  Up to size 12. A size 12 had excellent torsional and rotational  and axial stability. The trial standard offset neck was then placed  with a 36 + 1 trial  head. The hip was then reduced. We confirmed that  the stem was in the canal both on AP and lateral x-rays. It also has excellent sizing. The hip was reduced with outstanding stability through full extension, full external rotation,  and then flexion in adduction internal rotation. AP pelvis was taken  and the leg lengths were measured and found to be exactly equal. Hip  was then dislocated again and the femoral head and neck removed. The  femoral broach was removed. Size 12 Corail stem with a standard offset  neck was then impacted into the femur following native anteversion. Has  excellent purchase in the canal. Excellent torsional and rotational and  axial stability. It is confirmed to be in the canal on AP and lateral  fluoroscopic views. The 36 + 1 ceramic head was placed and the hip  reduced with outstanding stability. Again AP pelvis was taken and it  confirmed that the leg lengths were equal. The wound was then copiously  irrigated with saline solution and the capsule reattached and repaired  with Ethibond suture.  20 mL of Exparel mixed with 50 mL of saline injected  into the capsule and into the edge of the tensor fascia lata as well as  subcutaneous tissue. The fascia overlying the tensor fascia lata was  then closed with a running #1 V-Loc. Subcu was closed with interrupted  2-0 Vicryl and subcuticular running 4-0 Monocryl. Incision was cleaned  and dried. Steri-Strips and a bulky sterile dressing applied. Hemovac  drain was hooked to suction and then he was awakened and transported to  recovery in stable condition.        Please note that a surgical assistant was a medical necessity for this procedure to perform it in a safe and expeditious manner. Assistant was necessary to provide appropriate retraction of vital neurovascular structures and to prevent femoral fracture and allow for anatomic placement of the prosthesis.  Ollen Gross, M.D.

## 2012-04-21 NOTE — H&P (View-Only) (Signed)
Greg Huff  DOB: 05/07/1950 Divorced / Language: English / Race: White Male  Date of Admission:  04/21/2012  Chief Complaint:  Left Hip Pain  History of Present Illness The patient is a 62 year old male who comes in for a preoperative History and Physical. The patient is scheduled for a left to be performed by Dr. Gus Rankin. Aluisio, MD at [facility] on 04/21/2012. The patient is a 62 year old male who presents with a hip problem. The patient reports left hip and right hip problems including pain symptoms that have been present for 1 year(s). The symptoms began without any known injury.The patient feels as if their symptoms are does feel they are worsening. Prior to being seen today the patient was previously evaluated in this clinic by Dr. Darrelyn Hillock 6 months ago. Previous workup for this problem has included hip x-rays. He reports the left hip is much worse than the right. He has pain in the groin and into the anterior thigh. No numbness or tingling. The right hip does not really bother him at all. He was told that he had some arthritic changes in his right hip but he does not notice it. The left hip has been bothering him for about a year. Dr. Darrelyn Hillock recommended Celebrex if cleared by his cardiologist but it was not. He does take Tramadol but does not notice a big difference. He has had two intra-articular injections by Dr. Dierdre Forth. These last him about 2 months. His last one was two months ago. He is ready to discuss total hip. Unfortunately, his hip is getting progressively worse over time. He has pain at most times and with almost all activities. He used to be a runner but stopped doing that a few years ago. He even has pain with just walking now. He can sleep at night but does take awhile to get in a comfortable position. He's having increased functional limitations due to the hip.  He is ready to proceed with hip surgery. They have been treated conservatively in the past  for the above stated problem and despite conservative measures, they continue to have progressive pain and severe functional limitations and dysfunction. They have failed non-operative management including home exercise, medications. It is felt that they would benefit from undergoing total joint replacement. Risks and benefits of the procedure have been discussed with the patient and they elect to proceed with surgery. There are no active contraindications to surgery such as ongoing infection or rapidly progressive neurological disease.   Problem List Osteoarthritis, hip (715.35)   Allergies No Known Drug Allergies   Family History Cerebrovascular Accident. mother Congestive Heart Failure. grandmother mothers side Heart disease in male family member before age 42 Hypertension. mother and grandmother mothers side   Social History Living situation. live with partner Marital status. 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 no Exercise. Exercises daily; does running / walking and gym / weights Illicit drug use. no Number of flights of stairs before winded. greater than 5 Tobacco use. never smoker Pain Contract. no Previously in rehab. no Tobacco / smoke exposure. no Current work status. working full time Alcohol use. current drinker; drinks beer, wine and hard liquor; 8-14 per week Children. 2 Post-Surgical Plans. Plan is to go home. Advance Directives. None   Medication History Omeprazole (20MG  Tablet DR, Oral) Active. AndroGel Pump (20.25 MG/ACT(1.62%) Gel, Transdermal) Active. Vitamin B Complex ( Oral) Active. Metoprolol Tartrate (25MG  Tablet,  Oral) Active. Atorvastatin Calcium (20MG  Tablet, Oral) Active. Sertraline HCl (50MG  Tablet, Oral) Active. Xarelto (20MG  Tablet, Oral) Active. Aspirin Child (81MG  Tablet Chewable, Oral) Active. Lisinopril  (20MG  Tablet, Oral) Active. Cialis ( Oral) Specific dose unknown - Active. TraMADol HCl (50MG  Tablet, Oral) Active.   Past Surgical History Colon Polyp Removal - Colonoscopy Other Surgery. Date: 11/2005. Umbilical hernia Vasectomy   Medical History High blood pressure Gastroesophageal Reflux Disease Atrial Fibrillation Shingles Bronchitis Pneumonia Barrett's Esophagus. Past History - controlled with current medications. Eczema   Review of Systems General:Not Present- Chills, Fever, Night Sweats, Fatigue, Weight Gain, Weight Loss and Memory Loss. Skin:Not Present- Hives, Itching, Rash, Eczema and Lesions. HEENT:Not Present- Tinnitus, Headache, Double Vision, Visual Loss, Hearing Loss and Dentures. Respiratory:Not Present- Shortness of breath with exertion, Shortness of breath at rest, Allergies, Coughing up blood and Chronic Cough. Cardiovascular:Not Present- Chest Pain, Racing/skipping heartbeats, Difficulty Breathing Lying Down, Murmur, Swelling and Palpitations. Gastrointestinal:Present- Heartburn. Not Present- Bloody Stool, Abdominal Pain, Vomiting, Nausea, Constipation, Diarrhea, Difficulty Swallowing, Jaundice and Loss of appetitie. Male Genitourinary:Not Present- Urinary frequency, Blood in Urine, Weak urinary stream, Discharge, Flank Pain, Incontinence, Painful Urination, Urgency, Urinary Retention and Urinating at Night. Musculoskeletal:Present- Joint Pain. Not Present- Muscle Weakness, Muscle Pain, Joint Swelling, Back Pain, Morning Stiffness and Spasms. Neurological:Not Present- Tremor, Dizziness, Blackout spells, Paralysis, Difficulty with balance and Weakness. Psychiatric:Not Present- Insomnia.   Vitals Weight: 220 lb Height: 73 in Body Surface Area: 2.27 m Body Mass Index: 29.03 kg/m Pulse: 76 (Irregular) Resp.: 14 (Unlabored) BP: 142/88 (Sitting, Right Arm, Standard)    Physical Exam The physical exam findings are as  follows:  Note: Patient is a 62 year old male with continued hip pain.   General Mental Status - Alert, cooperative and good historian. General Appearance- pleasant. Not in acute distress. Orientation- Oriented X3. Build & Nutrition- Well nourished and Well developed.   Head and Neck Head- normocephalic, atraumatic . Neck Global Assessment- supple. no bruit auscultated on the right and no bruit auscultated on the left.   Eye Pupil- Bilateral- Regular and Round. Motion- Bilateral- EOMI.   Chest and Lung Exam Auscultation: Breath sounds:- clear at anterior chest wall and - clear at posterior chest wall. Adventitious sounds:- No Adventitious sounds.   Cardiovascular Auscultation:Rhythm- Regular rate and rhythm. Heart Sounds- S1 WNL and S2 WNL. Murmurs & Other Heart Sounds:Auscultation of the heart reveals - No Murmurs.   Abdomen Palpation/Percussion:Tenderness- Abdomen is non-tender to palpation. Rigidity (guarding)- Abdomen is soft. Auscultation:Auscultation of the abdomen reveals - Bowel sounds normal.   Male Genitourinary Not done, not pertinent to present illness  Musculoskeletal On exam, he is a well-developed male, alert and oriented, in no apparent distress. His right hip can be flexed to 110, rotated in 20, out 30, abducted 30 without discomfort. Left hip flexion 90. Minimal internal rotation, about 10 external rotation, about 30 abduction. He does have pain on rotation and on abduction.  Knee exam is normal. Pulses, sensation and motor are intact, both lower extremities. He walks with a slightly antalgic gait pattern, left leg.  RADIOGRAPHS: AP pelvis and lateral of the left hip taken today show bone on bone arthritis of the left hip. He has a shallow acetabulum with some dysplasia present.  Assessment & Plan Osteoarthritis, hip (715.35) Impression: Left Hip  Note: Patient is for a Left Total Hip Replacement by Dr.  Lequita Halt.  Plan is to go home.  PCP - Dr. Merri Brunette - Patient has been seen preoperatively and felt to  be stable for surgery. Cardiology - Dr. Herbie Baltimore - Patient has been seen preoperatively and felt to be stable for surgery. He is instructed to stop his ASA 5 days preop and his Xarelto 3 dayas preop. We will resume both medications postoperatively.  Signed electronically by Roberts Gaudy, PA-C

## 2012-04-21 NOTE — Progress Notes (Signed)
X-ray results noted 

## 2012-04-21 NOTE — Plan of Care (Signed)
Problem: Consults Goal: Diagnosis- Total Joint Replacement Left anterior hip     

## 2012-04-21 NOTE — Anesthesia Preprocedure Evaluation (Addendum)
Anesthesia Evaluation  Patient identified by MRN, date of birth, ID band Patient awake    Reviewed: Allergy & Precautions, H&P , NPO status , Patient's Chart, lab work & pertinent test results  Airway Mallampati: II TM Distance: >3 FB Neck ROM: Full    Dental no notable dental hx.    Pulmonary shortness of breath,  breath sounds clear to auscultation  Pulmonary exam normal       Cardiovascular hypertension, Pt. on medications and Pt. on home beta blockers + dysrhythmias Atrial Fibrillation Rhythm:Regular Rate:Normal     Neuro/Psych negative neurological ROS  negative psych ROS   GI/Hepatic Neg liver ROS, GERD-  Medicated,  Endo/Other  negative endocrine ROS  Renal/GU negative Renal ROS  negative genitourinary   Musculoskeletal negative musculoskeletal ROS (+)   Abdominal (+) + obese,   Peds negative pediatric ROS (+)  Hematology negative hematology ROS (+)   Anesthesia Other Findings   Reproductive/Obstetrics negative OB ROS                           Anesthesia Physical Anesthesia Plan  ASA: III  Anesthesia Plan: Spinal   Post-op Pain Management:    Induction: Intravenous  Airway Management Planned:   Additional Equipment:   Intra-op Plan:   Post-operative Plan: Extubation in OR  Informed Consent: I have reviewed the patients History and Physical, chart, labs and discussed the procedure including the risks, benefits and alternatives for the proposed anesthesia with the patient or authorized representative who has indicated his/her understanding and acceptance.   Dental advisory given  Plan Discussed with: CRNA  Anesthesia Plan Comments: (He has been off Xarelto for five days (this was mischarted as 2 days).Discussed risks/benefits of spinal including headache, backache, failure, bleeding, infection, and nerve damage. Patient consents to spinal. Questions answered. Coagulation  studies and platelet count acceptable.)       Anesthesia Quick Evaluation

## 2012-04-21 NOTE — Interval H&P Note (Signed)
History and Physical Interval Note:  04/21/2012 8:02 AM  Greg Huff  has presented today for surgery, with the diagnosis of osteoarthritis left hip   The various methods of treatment have been discussed with the patient and family. After consideration of risks, benefits and other options for treatment, the patient has consented to  Procedure(s): TOTAL HIP ARTHROPLASTY ANTERIOR APPROACH (Left) as a surgical intervention .  The patient's history has been reviewed, patient examined, no change in status, stable for surgery.  I have reviewed the patient's chart and labs.  Questions were answered to the patient's satisfaction.     Loanne Drilling

## 2012-04-21 NOTE — Evaluation (Signed)
Physical Therapy Evaluation Patient Details Name: Greg Huff MRN: [id number] DOB: [birth year] Today's Date: 04/21/2012 Time: 9629-5284 PT Time Calculation (min): 26 min  PT Assessment / Plan / Recommendation Clinical Impression  Pt s/p L THR presents with decreased L LE strength/ROM and post op pain limiting functional mobility    PT Assessment  Patient needs continued PT services    Follow Up Recommendations  Home health PT    Does the patient have the potential to tolerate intense rehabilitation      Barriers to Discharge None      Equipment Recommendations  Rolling walker with 5" wheels    Recommendations for Other Services OT consult   Frequency 7X/week    Precautions / Restrictions Precautions Precautions: Fall Restrictions Weight Bearing Restrictions: No Other Position/Activity Restrictions: WBAT   Pertinent Vitals/Pain 5/10, premed, ice provided, RN aware      Mobility  Bed Mobility Bed Mobility: Supine to Sit;Sit to Supine Supine to Sit: 4: Min assist Sit to Supine: 4: Min assist Details for Bed Mobility Assistance: cues for sequence and use of R LE to self assist Transfers Transfers: Sit to Stand;Stand to Sit Sit to Stand: 4: Min assist Stand to Sit: 4: Min assist Details for Transfer Assistance: cues for LE management and use of UEs to self assist Ambulation/Gait Ambulation/Gait Assistance: 4: Min assist Ambulation Distance (Feet): 37 Feet (and 15) Assistive device: Rolling walker Ambulation/Gait Assistance Details: cues for posture, sequence and position from RW Gait Pattern: Step-to pattern;Decreased step length - right;Decreased step length - left;Decreased stance time - left    Exercises     PT Diagnosis: Difficulty walking  PT Problem List: Decreased strength;Decreased range of motion;Decreased activity tolerance;Decreased mobility;Decreased knowledge of use of DME;Pain PT Treatment Interventions: DME instruction;Gait training;Stair  training;Functional mobility training;Therapeutic activities;Therapeutic exercise;Patient/family education   PT Goals Acute Rehab PT Goals PT Goal Formulation: With patient Time For Goal Achievement: 04/24/12 Potential to Achieve Goals: Good Pt will go Supine/Side to Sit: with supervision PT Goal: Supine/Side to Sit - Progress: Goal set today Pt will go Sit to Supine/Side: with supervision PT Goal: Sit to Supine/Side - Progress: Goal set today Pt will go Sit to Stand: with supervision PT Goal: Sit to Stand - Progress: Goal set today Pt will go Stand to Sit: with supervision PT Goal: Stand to Sit - Progress: Goal set today Pt will Ambulate: 51 - 150 feet;with supervision;with rolling walker PT Goal: Ambulate - Progress: Goal set today Pt will Go Up / Down Stairs: 1-2 stairs;with min assist;with least restrictive assistive device PT Goal: Up/Down Stairs - Progress: Goal set today  Visit Information  Last PT Received On: 04/21/12 Assistance Needed: +1    Subjective Data  Subjective: I need to use the bathroom Patient Stated Goal: Resume previous lifestyle with decreased pain   Prior Functioning  Home Living Lives With: Spouse Available Help at Discharge: Family Type of Home: House Home Access: Stairs to enter Secretary/administrator of Steps: 2 Entrance Stairs-Rails: None Home Layout: One level Home Adaptive Equipment: Straight cane Prior Function Level of Independence: Independent Able to Take Stairs?: Yes Driving: Yes Vocation: Full time employment Communication Communication: No difficulties Dominant Hand: Right    Cognition  Cognition Overall Cognitive Status: Appears within functional limits for tasks assessed/performed Arousal/Alertness: Awake/alert Orientation Level: Appears intact for tasks assessed Behavior During Session: [facility] for tasks performed    Extremity/Trunk Assessment Right Upper Extremity Assessment RUE ROM/Strength/Tone: [facility] for tasks  assessed Left Upper Extremity Assessment  LUE ROM/Strength/Tone: [facility] for tasks assessed Right Lower Extremity Assessment RLE ROM/Strength/Tone: [facility] for tasks assessed Left Lower Extremity Assessment LLE ROM/Strength/Tone: Deficits LLE ROM/Strength/Tone Deficits: hip strength 2/5 with AAROM to 80 flex   Balance    End of Session PT - End of Session Nurse Communication: Mobility status  GP     Alexie Samson 04/21/2012, 3:41 PM

## 2012-04-21 NOTE — Progress Notes (Signed)
Portable AP Pelvis and AP Left  Hip X-rays done.

## 2012-04-21 NOTE — Anesthesia Procedure Notes (Signed)
Spinal  Patient location during procedure: OR End time: 04/21/2012 8:39 AM Staffing CRNA/Resident: Enriqueta Shutter Performed by: anesthesiologist and resident/CRNA  Preanesthetic Checklist Completed: patient identified, site marked, surgical consent, pre-op evaluation, timeout performed, IV checked, risks and benefits discussed and monitors and equipment checked Spinal Block Patient position: sitting Prep: Betadine Patient monitoring: heart rate, continuous pulse ox and blood pressure Location: L2-3 Injection technique: single-shot Needle Needle type: Sprotte  Needle gauge: 24 G Needle length: 9 cm Assessment Sensory level: T6 Additional Notes Expiration date of kit checked and confirmed. Patient tolerated procedure well, without complications.

## 2012-04-21 NOTE — Progress Notes (Signed)
Dr. Council Mechanic made aware of patient's EKG readings- Atrial Fibrillation.

## 2012-04-21 NOTE — Transfer of Care (Signed)
Immediate Anesthesia Transfer of Care Note  Patient: Greg Huff  Procedure(s) Performed: Procedure(s): TOTAL HIP ARTHROPLASTY ANTERIOR APPROACH (Left)  Patient Location: PACU  Anesthesia Type:Regional  Level of Consciousness: awake, alert  and oriented  Airway & Oxygen Therapy: Patient Spontanous Breathing and Patient connected to face mask oxygen  Post-op Assessment: Report given to PACU RN and Post -op Vital signs reviewed and stable  Post vital signs: Reviewed and stable  Complications: No apparent anesthesia complications

## 2012-04-22 LAB — CBC
MCV: 97.4 fL (ref 78.0–100.0)
Platelets: 179 10*3/uL (ref 150–400)
RDW: 13.8 % (ref 11.5–15.5)
WBC: 7.7 10*3/uL (ref 4.0–10.5)

## 2012-04-22 LAB — BASIC METABOLIC PANEL
Calcium: 8.4 mg/dL (ref 8.4–10.5)
Chloride: 100 mEq/L (ref 96–112)
Creatinine, Ser: 0.75 mg/dL (ref 0.50–1.35)
GFR calc Af Amer: >90 mL/min (ref >90)
GFR calc non Af Amer: >90 mL/min (ref >90)

## 2012-04-22 MED ORDER — METHOCARBAMOL 500 MG PO TABS: 500.0000 mg | ORAL_TABLET | Freq: Four times a day (QID) | ORAL | Status: DC | PRN

## 2012-04-22 MED ORDER — NON FORMULARY: 20.0000 mg | Freq: Every morning | Status: DC

## 2012-04-22 MED ORDER — OMEPRAZOLE 20 MG PO CPDR
20.0000 mg | DELAYED_RELEASE_CAPSULE | Freq: Every day | ORAL | Status: DC
  Administered 2012-04-22 – 2012-04-23 (×2): 20 mg via ORAL
  Filled 2012-04-22 (×2): qty 1

## 2012-04-22 MED ORDER — OXYCODONE HCL 5 MG PO TABS
5.0000 mg | ORAL_TABLET | ORAL | Status: DC | PRN
Start: 2012-04-22 — End: 2014-12-28

## 2012-04-22 NOTE — Progress Notes (Signed)
Physical Therapy Treatment Patient Details Name: Greg Huff MRN: [id number] DOB: 1950/04/12 Today's Date: 04/22/2012 Time: 4098-1191 PT Time Calculation (min): 27 min  PT Assessment / Plan / Recommendation Comments on Treatment Session  POD # 1 pm session.  Assisted pt out of recliner to amb in hallway 2nd time.  Pt more able to advance L LE and amb furthur. Assisted pt back to bed and performed TE's.    Follow Up Recommendations  Home health PT     Does the patient have the potential to tolerate intense rehabilitation     Barriers to Discharge        Equipment Recommendations  Rolling walker with 5" wheels    Recommendations for Other Services OT consult  Frequency 7X/week   Plan      Precautions / Restrictions Precautions Precautions: Fall;None Precaution Comments: Direct Anterior Approach THR Restrictions Weight Bearing Restrictions: No Other Position/Activity Restrictions: WBAT   Pertinent Vitals/Pain C/o 4/10 L hip pain "soreness" ICE applied    Mobility  Bed Mobility Bed Mobility: Supine to Sit;Sitting - Scoot to [place] of Bed;Sit to Supine Supine to Sit: 4: Min assist Details for Bed Mobility Assistance: increased time and Min assist to support L LE due to increased c/o pain Transfers Transfers: Sit to Stand;Stand to Sit Sit to Stand: 4: Min guard;From bed Stand to Sit: 4: Min guard;To chair/3-in-1 Details for Transfer Assistance: 50% VC's on proper tech and hand placement Ambulation/Gait Ambulation/Gait Assistance: 4: Min assist Ambulation Distance (Feet): 27 Feet Assistive device: Rolling walker Ambulation/Gait Assistance Details: 50% VC's on proper walker to self distance and L LE placement Gait Pattern: Step-to pattern;Decreased step length - right;Decreased step length - left;Decreased stance time - left Gait velocity: decreased    Exercises Total Joint Exercises Ankle Circles/Pumps: AROM;Both;10 reps;Seated Quad Sets: AROM;Both;10  reps;Seated Gluteal Sets: AROM;Both;10 reps;Seated Heel Slides: AAROM;Left;10 reps;Supine Hip ABduction/ADduction: AAROM;Left;10 reps;Supine    PT Goals                                                      progressing    Visit Information  Last PT Received On: 04/22/12 Assistance Needed: +1    Subjective Data  Subjective: I need pain meds first Patient Stated Goal: home   Cognition       Balance     End of Session PT - End of Session Equipment Utilized During Treatment: Gait belt Activity Tolerance: Patient tolerated treatment well Patient left: with call bell/phone within reach;with family/visitor present;Other (comment);in bed (ICE to L hip)   Felecia Shelling  PTA [facility]  Acute  Rehab Pager      [phone]

## 2012-04-22 NOTE — Progress Notes (Signed)
   Subjective: 1 Day Post-Op Procedure(s) (LRB): TOTAL HIP ARTHROPLASTY ANTERIOR APPROACH (Left) Patient reports pain as mild.   Patient seen in rounds with Dr. Lequita Halt.  Wife in room. Patient is well, and has had no acute complaints or problems We will start therapy today.  Plan is to go Home after hospital stay.  Objective: Vital signs in last 24 hours: Temp:  [97.3 F (36.3 C)-98.6 F (37 C)] 98.6 F (37 C) (02/13 0426) Pulse Rate:  [60-96] 96 (02/13 0426) Resp:  [12-17] 16 (02/13 0426) BP: (109-177)/(61-106) 133/87 mmHg (02/13 0426) SpO2:  [94 %-100 %] 99 % (02/13 0426) Weight:  [100.699 kg (222 lb)-102.059 kg (225 lb)] 100.699 kg (222 lb) (02/12 1615)  Intake/Output from previous day:  Intake/Output Summary (Last 24 hours) at 04/22/12 0914 Last data filed at 04/22/12 0911  Gross per 24 hour  Intake   3510 ml  Output   4480 ml  Net   -970 ml    Intake/Output this shift: Total I/O In: -  Out: 525 [Urine:525]  Labs:  Recent Labs  04/22/12 0429  HGB 11.4*    Recent Labs  04/22/12 0429  WBC 7.7  RBC 3.51*  HCT 34.2*  PLT 179    Recent Labs  04/22/12 0429  NA 135  K 3.8  CL 100  CO2 28  BUN 6  CREATININE 0.75  GLUCOSE 115*  CALCIUM 8.4   No results found for this basename: LABPT, INR,  in the last 72 hours  EXAM General - Patient is Alert, Appropriate and Oriented Extremity - Neurovascular intact Sensation intact distally Dorsiflexion/Plantar flexion intact Dressing - dressing C/D/I Motor Function - intact, moving foot and toes well on exam.  Hemovac pulled without difficulty.  Past Medical History  Diagnosis Date  . Hypertension   . Dysrhythmia     atrial fib   . Depression   . Shortness of breath     with exertion and associated with atrial fib   . GERD (gastroesophageal reflux disease)   . Arthritis     Assessment/Plan: 1 Day Post-Op Procedure(s) (LRB): TOTAL HIP ARTHROPLASTY ANTERIOR APPROACH (Left) Principal Problem:   OA  (osteoarthritis) of hip  Estimated body mass index is 34.76 kg/(m^2) as calculated from the following:   Height as of this encounter: 5\' 7"  (1.702 m).   Weight as of this encounter: 100.699 kg (222 lb). Up with therapy Plan for discharge tomorrow Discharge home with home health  DVT Prophylaxis - Xarelto Weight Bearing As Tolerated left Leg Hemovac Pulled Begin Therapy No vaccines.  Greg Huff 04/22/2012, 9:14 AM

## 2012-04-22 NOTE — Plan of Care (Signed)
Problem: Consults Goal: Diagnosis- Total Joint Replacement Outcome: Completed/Met Date Met:  04/22/12 Primary Total Hip LEFT Anterior

## 2012-04-22 NOTE — Evaluation (Signed)
Occupational Therapy Evaluation Patient Details Name: Greg Huff MRN: [id number] DOB: 26-Aug-1950 Today's Date: 04/22/2012 Time: 7829-5621 OT Time Calculation (min): 34 min  OT Assessment / Plan / Recommendation Clinical Impression  This 62 y.o. male admitted for anterior approach Lt. THA.  All education completed. Pt. able to perform BADLs with supervision to min guard assist.  No further OT needs identified.  Will sign off.    OT Assessment  Patient does not need any further OT services    Follow Up Recommendations  No OT follow up;Supervision - Intermittent    Barriers to Discharge      Equipment Recommendations  None recommended by OT    Recommendations for Other Services    Frequency       Precautions / Restrictions Precautions Precautions: Fall Restrictions Weight Bearing Restrictions: No       ADL  Eating/Feeding: Independent Where Assessed - Eating/Feeding: Chair Grooming: Wash/dry hands;Wash/dry face;Teeth care;Supervision/safety Where Assessed - Grooming: Supported standing Upper Body Bathing: Supervision/safety Where Assessed - Upper Body Bathing: Unsupported sitting Lower Body Bathing: Supervision/safety Where Assessed - Lower Body Bathing: Supported sit to stand Upper Body Dressing: Set up Where Assessed - Upper Body Dressing: Unsupported sitting Lower Body Dressing: Supervision/safety Where Assessed - Lower Body Dressing: Supported sit to stand Toilet Transfer: Hydrographic surveyor Method: Sit to Barista: Regular height toilet;Grab bars Toileting - Architect and Hygiene: Supervision/safety Where Assessed - Engineer, mining and Hygiene: Standing Tub/Shower Transfer: Minimal assistance Web designer Method: Science writer: Walk in Scientist, research (physical sciences) Used: Rolling walker Transfers/Ambulation Related to ADLs: Pt ambulates in room with supervision ADL Comments: Pt.  able to access lt. foot for ADLs by crossing legs, and/or bending forward.  Discussed use of LH sponge if needed.  Pt. able to perform simulated shower in standing with min guard assist.    OT Diagnosis:    OT Problem List:   OT Treatment Interventions:     OT Goals    Visit Information  Last OT Received On: 04/22/12 Assistance Needed: +1    Subjective Data  Subjective: "That is kind of important"  re: ADLs Patient Stated Goal: To get back to work   Prior Functioning     Home Living Lives With: Spouse Available Help at Discharge: Family Type of Home: House Home Access: Stairs to enter Secretary/administrator of Steps: 2 Entrance Stairs-Rails: None Home Layout: One level Bathroom Shower/Tub: Health visitor: Standard Bathroom Accessibility: Yes How Accessible: Accessible via walker Home Adaptive Equipment: Straight cane Prior Function Level of Independence: Independent Able to Take Stairs?: Yes Driving: Yes Vocation: Full time employment 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 Communication Communication: No difficulties Dominant Hand: Right         Vision/Perception     Copywriter, advertising Overall Cognitive Status: Appears within functional limits for tasks assessed/performed Arousal/Alertness: Awake/alert Orientation Level: Appears intact for tasks assessed Behavior During Session: [facility] for tasks performed    Extremity/Trunk Assessment Right Upper Extremity Assessment RUE ROM/Strength/Tone: WFL for tasks assessed RUE Coordination: WFL - gross/fine motor Left Upper Extremity Assessment LUE ROM/Strength/Tone: WFL for tasks assessed LUE Coordination: WFL - gross/fine motor Trunk Assessment Trunk Assessment: Normal     Mobility Bed Mobility Bed Mobility: Supine to Sit;Sitting - Scoot to Edge of Bed;Sit to Supine Supine to Sit: 5: Supervision;HOB flat Sitting - Scoot to Edge of Bed: 5: Supervision Sit to Supine:  4: Min assist;5: Supervision;HOB flat Details for  Bed Mobility Assistance: Pt initially required cue for tecnique and to lift Lt. LE onto bed; but with second attempt/practice, able to perform with supervision Transfers Transfers: Sit to Stand;Stand to Sit Sit to Stand: 5: Supervision;With upper extremity assist;From chair/3-in-1;From bed;From toilet Stand to Sit: 5: Supervision;With upper extremity assist;To bed;To chair/3-in-1;To toilet     Exercise     Balance     End of Session OT - End of Session Activity Tolerance: Patient tolerated treatment well Patient left: in chair;with call bell/phone within reach;with family/visitor present  GO     Mathis Cashman, Ursula Alert M 04/22/2012, 11:02 AM

## 2012-04-22 NOTE — Discharge Summary (Signed)
Physician Discharge Summary   Patient ID: Greg Huff MRN: [id number] DOB/AGE: [birth year] 62 y.o.  Admit date: 04/21/2012 Discharge date: 04/23/2012  Primary Diagnosis:  Osteoarthritis of the Left hip.  Admission Diagnoses:  Past Medical History  Diagnosis Date  . Hypertension   . Dysrhythmia     atrial fib   . Depression   . Shortness of breath     with exertion and associated with atrial fib   . GERD (gastroesophageal reflux disease)   . Arthritis    Discharge Diagnoses:   Principal Problem:   OA (osteoarthritis) of hip  Estimated body mass index is 34.76 kg/(m^2) as calculated from the following:   Height as of this encounter: 5\' 7"  (1.702 m).   Weight as of this encounter: 100.699 kg (222 lb).  Classification of overweight in adults according to BMI (WHO, 1998)   Procedure: Procedure(s) (LRB): TOTAL HIP ARTHROPLASTY ANTERIOR APPROACH (Left)   Consults: None  HPI: Greg Huff is a 62 y.o. male who has advanced end-  stage arthritis of his Left hip with progressively worsening pain and  dysfunction.The patient has failed nonoperative management and presents for  total hip arthroplasty.   Laboratory Data: Admission on 04/21/2012  Component Date Value Range Status  . ABO/RH(D) 04/21/2012 B POS   Final  . Antibody Screen 04/21/2012 NEG   Final  . Sample Expiration 04/21/2012 04/24/2012   Final  . ABO/RH(D) 04/21/2012 B POS   Final  . WBC 04/22/2012 7.7  4.0 - 10.5 K/uL Final  . RBC 04/22/2012 3.51* 4.22 - 5.81 MIL/uL Final  . Hemoglobin 04/22/2012 11.4* 13.0 - 17.0 g/dL Final  . HCT 40/98/1191 34.2* 39.0 - 52.0 % Final  . MCV 04/22/2012 97.4  78.0 - 100.0 fL Final  . MCH 04/22/2012 32.5  26.0 - 34.0 pg Final  . MCHC 04/22/2012 33.3  30.0 - 36.0 g/dL Final  . RDW 47/82/9562 13.8  11.5 - 15.5 % Final  . Platelets 04/22/2012 179  150 - 400 K/uL Final  . Sodium 04/22/2012 135  135 - 145 mEq/L Final  . Potassium 04/22/2012 3.8  3.5 - 5.1 mEq/L Final  .  Chloride 04/22/2012 100  96 - 112 mEq/L Final  . CO2 04/22/2012 28  19 - 32 mEq/L Final  . Glucose, Bld 04/22/2012 115* 70 - 99 mg/dL Final  . BUN 13/10/6576 6  6 - 23 mg/dL Final  . Creatinine, Ser 04/22/2012 0.75  0.50 - 1.35 mg/dL Final  . Calcium 46/96/2952 8.4  8.4 - 10.5 mg/dL Final  . GFR calc non Af Amer 04/22/2012 >90  >90 mL/min Final  . GFR calc Af Amer 04/22/2012 >90  >90 mL/min Final   Comment:                                 The eGFR has been calculated                          using the CKD EPI equation.                          This calculation has not been                          validated in all clinical  situations.                          eGFR's persistently                          <90 mL/min signify                          possible Chronic Kidney Disease.  Hospital Outpatient Visit on 04/14/2012  Component Date Value Range Status  . aPTT 04/14/2012 31  24 - 37 seconds Final  . WBC 04/14/2012 7.2  4.0 - 10.5 K/uL Final  . RBC 04/14/2012 4.31  4.22 - 5.81 MIL/uL Final  . Hemoglobin 04/14/2012 14.0  13.0 - 17.0 g/dL Final  . HCT 45/40/9811 40.3  39.0 - 52.0 % Final  . MCV 04/14/2012 93.5  78.0 - 100.0 fL Final  . MCH 04/14/2012 32.5  26.0 - 34.0 pg Final  . MCHC 04/14/2012 34.7  30.0 - 36.0 g/dL Final  . RDW 91/47/8295 12.7  11.5 - 15.5 % Final  . Platelets 04/14/2012 238  150 - 400 K/uL Final  . Sodium 04/14/2012 141  135 - 145 mEq/L Final  . Potassium 04/14/2012 4.6  3.5 - 5.1 mEq/L Final  . Chloride 04/14/2012 106  96 - 112 mEq/L Final  . CO2 04/14/2012 24  19 - 32 mEq/L Final  . Glucose, Bld 04/14/2012 89  70 - 99 mg/dL Final  . BUN 62/13/0865 18  6 - 23 mg/dL Final  . Creatinine, Ser 04/14/2012 0.73  0.50 - 1.35 mg/dL Final  . Calcium 78/46/9629 9.2  8.4 - 10.5 mg/dL Final  . Total Protein 04/14/2012 6.3  6.0 - 8.3 g/dL Final  . Albumin 52/84/1324 3.8  3.5 - 5.2 g/dL Final  . AST 40/12/2723 25  0 - 37 U/L Final  . ALT  04/14/2012 28  0 - 53 U/L Final  . Alkaline Phosphatase 04/14/2012 66  39 - 117 U/L Final  . Total Bilirubin 04/14/2012 0.3  0.3 - 1.2 mg/dL Final  . GFR calc non Af Amer 04/14/2012 >90  >90 mL/min Final  . GFR calc Af Amer 04/14/2012 >90  >90 mL/min Final   Comment:                                 The eGFR has been calculated                          using the CKD EPI equation.                          This calculation has not been                          validated in all clinical                          situations.                          eGFR's persistently                          <90  mL/min signify                          possible Chronic Kidney Disease.  Marland Kitchen Prothrombin Time 04/14/2012 15.6* 11.6 - 15.2 seconds Final  . INR 04/14/2012 1.27  0.00 - 1.49 Final  . Color, Urine 04/14/2012 YELLOW  YELLOW Final  . APPearance 04/14/2012 CLOUDY* CLEAR Final  . Specific Gravity, Urine 04/14/2012 1.022  1.005 - 1.030 Final  . pH 04/14/2012 5.5  5.0 - 8.0 Final  . Glucose, UA 04/14/2012 NEGATIVE  NEGATIVE mg/dL Final  . Hgb urine dipstick 04/14/2012 NEGATIVE  NEGATIVE Final  . Bilirubin Urine 04/14/2012 SMALL* NEGATIVE Final  . Ketones, ur 04/14/2012 TRACE* NEGATIVE mg/dL Final  . Protein, ur 16/12/9602 NEGATIVE  NEGATIVE mg/dL Final  . Urobilinogen, UA 04/14/2012 1.0  0.0 - 1.0 mg/dL Final  . Nitrite 54/11/8117 NEGATIVE  NEGATIVE Final  . Leukocytes, UA 04/14/2012 NEGATIVE  NEGATIVE Final   MICROSCOPIC NOT DONE ON URINES WITH NEGATIVE PROTEIN, BLOOD, LEUKOCYTES, NITRITE, OR GLUCOSE <1000 mg/dL.  Marland Kitchen MRSA, PCR 04/14/2012 NEGATIVE  NEGATIVE Final  . Staphylococcus aureus 04/14/2012 NEGATIVE  NEGATIVE Final   Comment:                                 The Xpert SA Assay (FDA                          approved for NASAL specimens                          in patients over 7 years of age),                          is one component of                          a comprehensive surveillance                           program.  Test performance has                          been validated by [employer] for patients greater                          than or equal to 37 year old.                          It is not intended                          to diagnose infection nor to                          guide or monitor treatment.     X-Rays:Dg Chest 2 View  04/14/2012  *RADIOLOGY REPORT*  Clinical Data: Preop left total hip  CHEST - 2 VIEW  Comparison: None  Findings: Normal heart  size and normal vascularity.  Lungs are clear without infiltrate or effusion.  No mass or adenopathy.  IMPRESSION: No acute cardiopulmonary abnormality.   Original Report Authenticated By: Janeece Riggers, M.D.    Dg Hip Complete Left  04/14/2012  *RADIOLOGY REPORT*  Clinical Data: Preop for left total hip replacement, left hip pain  LEFT HIP - COMPLETE 2+ VIEW  Comparison: None.  Findings: There is moderate degenerative joint disease in the right hip with more advanced degenerative joint disease in the left hip where there is loss of joint space, sclerosis, and spurring.  No acute fracture is seen.  The pelvic rami are intact.  The SI joints are corticated.  There are degenerative changes in the lower lumbar spine as well.  A probable metallic foreign body overlies the transverse process on the left at L3.  IMPRESSION: Degenerative joint disease of the hips left greater than right.  No acute abnormality.   Original Report Authenticated By: Dwyane Dee, M.D.    Dg Pelvis Portable  04/21/2012  *RADIOLOGY REPORT*  Clinical Data: Status post left hip replacement.  Postoperative study.  PORTABLE PELVIS,PORTABLE LEFT HIP - 1 VIEW  Comparison: No priors.  Findings: The AP and lateral views of the left hip demonstrate postoperative changes of left total hip arthroplasty.  The femoral and acetabular components of the prosthesis appear to be properly seated without definite periprosthetic fracture or other  immediate complicating features.  The femoral head appears properly located. There is an overlying surgical drain in place and extensive surrounding subcutaneous emphysema which is expected.  Degenerative changes of osteoarthritis are noted in the right hip joint.  IMPRESSION: 1.  Status post left total hip arthroplasty without immediate complicating features.   Original Report Authenticated By: Trudie Reed, M.D.    Dg Hip Portable 1 View Left  04/21/2012  *RADIOLOGY REPORT*  Clinical Data: Status post left hip replacement.  Postoperative study.  PORTABLE PELVIS,PORTABLE LEFT HIP - 1 VIEW  Comparison: No priors.  Findings: The AP and lateral views of the left hip demonstrate postoperative changes of left total hip arthroplasty.  The femoral and acetabular components of the prosthesis appear to be properly seated without definite periprosthetic fracture or other immediate complicating features.  The femoral head appears properly located. There is an overlying surgical drain in place and extensive surrounding subcutaneous emphysema which is expected.  Degenerative changes of osteoarthritis are noted in the right hip joint.  IMPRESSION: 1.  Status post left total hip arthroplasty without immediate complicating features.   Original Report Authenticated By: Trudie Reed, M.D.    Dg C-arm 1-60 Min-no Report  04/21/2012  CLINICAL DATA: left total hip arthroplasty   C-ARM 1-60 MINUTES  Fluoroscopy was utilized by the requesting physician.  No radiographic  interpretation.      EKG:No orders found for this or any previous visit.   Hospital Course: Patient was admitted to [facility] and taken to the OR and underwent the above state procedure without complications.  Patient tolerated the procedure well and was later transferred to the recovery room and then to the orthopaedic floor for postoperative care.  They were given PO and IV analgesics for pain control following their surgery.  They were  given 24 hours of postoperative antibiotics of  Anti-infectives   Start     Dose/Rate Route Frequency Ordered Stop   04/21/12 1430  ceFAZolin (ANCEF) IVPB 2 g/50 mL premix     2 g 100 mL/hr over 30 Minutes Intravenous Every  6 hours 04/21/12 1222 04/21/12 2004   04/21/12 0645  ceFAZolin (ANCEF) IVPB 2 g/50 mL premix     2 g 100 mL/hr over 30 Minutes Intravenous On call to O.R. 04/21/12 1610 04/21/12 0842     and started on DVT prophylaxis in the form of Xarelto.   PT and OT were ordered for total hip protocol.  The patient was allowed to be WBAT with therapy. Discharge planning was consulted to help with postop disposition and equipment needs.  Patient had a good night on the evening of surgery and started to get up OOB with therapy on day one.  Hemovac drain was pulled without difficulty.  The knee immobilizer was removed and discontinued.  Continued to work with therapy into day two.  Dressing was changed on day two and the incision was healing well.   Patient was seen in rounds and was ready to go home later that same day.   Discharge Medications: Prior to Admission medications   Medication Sig Start Date End Date Taking? Authorizing Provider  atorvastatin (LIPITOR) 20 MG tablet Take 20 mg by mouth every morning.    Yes Historical Provider, MD  lisinopril (PRINIVIL,ZESTRIL) 20 MG tablet Take 20 mg by mouth every morning.    Yes Historical Provider, MD  metoprolol tartrate (LOPRESSOR) 25 MG tablet Take 25 mg by mouth 2 (two) times daily.   Yes Historical Provider, MD  omeprazole (PRILOSEC) 20 MG capsule Take 20 mg by mouth every morning.    Yes Historical Provider, MD  sertraline (ZOLOFT) 50 MG tablet Take 50 mg by mouth every morning.    Yes Historical Provider, MD  traMADol (ULTRAM) 50 MG tablet Take 50 mg by mouth every 6 (six) hours as needed for pain.    Yes Historical Provider, MD  aspirin 81 MG tablet Take 81 mg by mouth every morning.     Historical Provider, MD  methocarbamol  (ROBAXIN) 500 MG tablet Take 1 tablet (500 mg total) by mouth every 6 (six) hours as needed. 04/22/12   Alexzandrew Julien Girt, PA  oxyCODONE (OXY IR/ROXICODONE) 5 MG immediate release tablet Take 1-2 tablets (5-10 mg total) by mouth every 3 (three) hours as needed. 04/22/12   Alexzandrew Julien Girt, PA  rivaroxaban (XARELTO) 10 MG TABS tablet Take 20 mg by mouth at bedtime.     Historical Provider, MD  triamcinolone (KENALOG) 0.025 % cream Apply 1 application topically daily as needed. For itching    Historical Provider, MD    Diet: Cardiac diet Activity:WBAT No bending hip over 90 degrees- A "L" Angle Do not cross legs Do not let foot roll inward When turning these patients a pillow should be placed between the patient's legs to prevent crossing. Patients should have the affected knee fully extended when trying to sit or stand from all surfaces to prevent excessive hip flexion. When ambulating and turning toward the affected side the affected leg should have the toes turned out prior to moving the walker and the rest of patient's body as to prevent internal rotation/ turning in of the leg. Abduction pillows are the most effective way to prevent a patient from not crossing legs or turning toes in at rest. If an abduction pillow is not ordered placing a regular pillow length wise between the patient's legs is also an effective reminder. It is imperative that these precautions be maintained so that the surgical hip does not dislocate. Follow-up:in 2 weeks Disposition - Home Discharged Condition: good   Discharge Orders  Future Orders Complete By Expires     Call MD / Call 911  As directed     Comments:      If you experience chest pain or shortness of breath, CALL 911 and be transported to the hospital emergency room.  If you develope a fever above 101 F, pus (white drainage) or increased drainage or redness at the wound, or calf pain, call your surgeon's office.    Change dressing  As directed       Comments:      You may change your dressing dressing daily with sterile 4 x 4 inch gauze dressing and paper tape.  Do not submerge the incision under water.    Constipation Prevention  As directed     Comments:      Drink plenty of fluids.  Prune juice may be helpful.  You may use a stool softener, such as Colace (over the counter) 100 mg twice a day.  Use MiraLax (over the counter) for constipation as needed.    Diet - low sodium heart healthy  As directed     Discharge instructions  As directed     Comments:      Pick up stool softner and laxative for home. Do not submerge incision under water. May shower. Continue to use ice for pain and swelling from surgery.  Resume 20 mg Xarelto at home starting Saturday. May resume the 81 mg Aspirin starting Saturday.    Do not sit on low chairs, stoools or toilet seats, as it may be difficult to get up from low surfaces  As directed     Driving restrictions  As directed     Comments:      No driving until released by the physician.    Increase activity slowly as tolerated  As directed     Lifting restrictions  As directed     Comments:      No lifting until released by the physician.    Patient may shower  As directed     Comments:      You may shower without a dressing once there is no drainage.  Do not wash over the wound.  If drainage remains, do not shower until drainage stops.    TED hose  As directed     Comments:      Use stockings (TED hose) for 3 weeks on both leg(s).  You may remove them at night for sleeping.    Weight bearing as tolerated  As directed         Medication List    STOP taking these medications       b complex vitamins tablet     multivitamin tablet     tadalafil 20 MG tablet  Commonly known as:  CIALIS     testosterone enanthate 200 MG/ML injection  Commonly known as:  DELATESTRYL      TAKE these medications       aspirin 81 MG tablet  Take 81 mg by mouth every morning.     atorvastatin 20 MG  tablet  Commonly known as:  LIPITOR  Take 20 mg by mouth every morning.     lisinopril 20 MG tablet  Commonly known as:  PRINIVIL,ZESTRIL  Take 20 mg by mouth every morning.     methocarbamol 500 MG tablet  Commonly known as:  ROBAXIN  Take 1 tablet (500 mg total) by mouth every 6 (six) hours as needed.     metoprolol  tartrate 25 MG tablet  Commonly known as:  LOPRESSOR  Take 25 mg by mouth 2 (two) times daily.     omeprazole 20 MG capsule  Commonly known as:  PRILOSEC  Take 20 mg by mouth every morning.     oxyCODONE 5 MG immediate release tablet  Commonly known as:  Oxy IR/ROXICODONE  Take 1-2 tablets (5-10 mg total) by mouth every 3 (three) hours as needed.     rivaroxaban 10 MG Tabs tablet  Commonly known as:  XARELTO  Take 20 mg by mouth at bedtime.     sertraline 50 MG tablet  Commonly known as:  ZOLOFT  Take 50 mg by mouth every morning.     traMADol 50 MG tablet  Commonly known as:  ULTRAM  Take 50 mg by mouth every 6 (six) hours as needed for pain.     triamcinolone 0.025 % cream  Commonly known as:  KENALOG  Apply 1 application topically daily as needed. For itching           Follow-up Information   Follow up with Loanne Drilling, MD. Schedule an appointment as soon as possible for a visit in 2 weeks.   Contact information:   [address], [address] [place] [address] [phone]       Signed: Patrica Duel 04/22/2012, 10:00 AM

## 2012-04-22 NOTE — Progress Notes (Signed)
Physical Therapy Treatment Patient Details Name: Greg Huff MRN: [id number] DOB: 29-Jun-1950 Today's Date: 04/22/2012 Time: 4098-1191 PT Time Calculation (min): 27 min  PT Assessment / Plan / Recommendation Comments on Treatment Session  POD # 1 L Direct Anterior Approach THR.  Assisted pt OOB to amb in hallway limited distance due to fatigue then assist to recliner.  Pt demon max difficulty advancing L LE and fatigue quickly with excessive WBing thru RW.    Follow Up Recommendations  Home health PT     Does the patient have the potential to tolerate intense rehabilitation     Barriers to Discharge        Equipment Recommendations  Rolling walker with 5" wheels    Recommendations for Other Services OT consult  Frequency 7X/week   Plan      Precautions / Restrictions Precautions Precautions: Fall;None Precaution Comments: Direct Anterior Approach THR Restrictions Weight Bearing Restrictions: No Other Position/Activity Restrictions: WBAT   Pertinent Vitals/Pain C/o 8/10 L hip pain with act ICE applied    Mobility  Bed Mobility Bed Mobility: Supine to Sit;Sitting - Scoot to [place] of Bed;Sit to Supine Supine to Sit: 4: Min assist Details for Bed Mobility Assistance: increased time and Min assist to support L LE due to increased c/o pain Transfers Transfers: Sit to Stand;Stand to Sit Sit to Stand: 4: Min guard;From bed Stand to Sit: 4: Min guard;To chair/3-in-1 Details for Transfer Assistance: 50% VC's on proper tech and hand placement Ambulation/Gait Ambulation/Gait Assistance: 4: Min assist Ambulation Distance (Feet): 27 Feet Assistive device: Rolling walker Ambulation/Gait Assistance Details: 50% VC's on proper walker to self distance and L LE placement Gait Pattern: Step-to pattern;Decreased step length - right;Decreased step length - left;Decreased stance time - left Gait velocity: decreased    Exercises Total Joint Exercises Ankle Circles/Pumps: AROM;Both;10  reps;Seated Quad Sets: AROM;Both;10 reps;Seated Gluteal Sets: AROM;Both;10 reps;Seated   PT Goals                                                     progressing    Visit Information  Last PT Received On: 04/22/12 Assistance Needed: +1    Subjective Data  Subjective: I need pain meds first Patient Stated Goal: home   Cognition       Balance     End of Session PT - End of Session Equipment Utilized During Treatment: Gait belt Activity Tolerance: Patient tolerated treatment well Patient left: in chair;with call bell/phone within reach;with family/visitor present;Other (comment) (ICE to L hip)   Felecia Shelling  PTA [facility]  Acute  Rehab Pager      [phone]

## 2012-04-23 ENCOUNTER — Encounter (HOSPITAL_COMMUNITY): Payer: Self-pay | Admitting: Orthopedic Surgery

## 2012-04-23 LAB — CBC
HCT: 33.9 % — ABNORMAL LOW (ref 39.0–52.0)
Hemoglobin: 11.4 g/dL — ABNORMAL LOW (ref 13.0–17.0)
MCH: 32.5 pg (ref 26.0–34.0)
MCHC: 33.6 g/dL (ref 30.0–36.0)
MCV: 96.6 fL (ref 78.0–100.0)
Platelets: 182 K/uL (ref 150–400)
RBC: 3.51 MIL/uL — ABNORMAL LOW (ref 4.22–5.81)
RDW: 13.6 % (ref 11.5–15.5)
WBC: 12 K/uL — ABNORMAL HIGH (ref 4.0–10.5)

## 2012-04-23 LAB — BASIC METABOLIC PANEL WITH GFR
BUN: 7 mg/dL (ref 6–23)
CO2: 28 meq/L (ref 19–32)
Calcium: 9 mg/dL (ref 8.4–10.5)
Chloride: 101 meq/L (ref 96–112)
Creatinine, Ser: 0.65 mg/dL (ref 0.50–1.35)
GFR calc Af Amer: >90 mL/min
GFR calc non Af Amer: >90 mL/min
Glucose, Bld: 150 mg/dL — ABNORMAL HIGH (ref 70–99)
Potassium: 3.7 meq/L (ref 3.5–5.1)
Sodium: 137 meq/L (ref 135–145)

## 2012-04-23 NOTE — Progress Notes (Signed)
Physical Therapy Treatment Patient Details Name: CANON GOLA MRN: [id number] DOB: Jul 18, 1950 Today's Date: 04/23/2012 Time: 4696-2952 PT Time Calculation (min): 26 min  PT Assessment / Plan / Recommendation Comments on Treatment Session  POD #2 L Direct Anterior THR pt plans to D/C to home today.  Assisted pt OOB to amb in hallway, practiced going up down 2 steps with no rails then back to room to perform TE's. Pt given handout on HEP and steps.  Spouse present during session.    Follow Up Recommendations  Home health PT     Does the patient have the potential to tolerate intense rehabilitation     Barriers to Discharge        Equipment Recommendations  Rolling walker with 5" wheels    Recommendations for Other Services    Frequency 7X/week   Plan      Precautions / Restrictions Precautions Precautions: Fall;None Precaution Comments: Direct Anterior Approach THR Restrictions Weight Bearing Restrictions: No Other Position/Activity Restrictions: WBAT   Pertinent Vitals/Pain C/o "soreness" ICE applied    Mobility  Bed Mobility Bed Mobility: Supine to Sit;Sitting - Scoot to [place] of Bed;Sit to Supine Supine to Sit: 4: Min guard Sitting - Scoot to [place] of Bed: 5: Supervision Details for Bed Mobility Assistance: instructed pt how to use a belt to assist L LE off/on bed Transfers Transfers: Sit to Stand;Stand to Sit Sit to Stand: 5: Supervision;From bed Stand to Sit: 5: Supervision;To chair/3-in-1 Details for Transfer Assistance: one VC on safety with turns Ambulation/Gait Ambulation/Gait Assistance: 4: Min guard Ambulation Distance (Feet): 78 Feet Assistive device: Rolling walker Ambulation/Gait Assistance Details: <25% VC's on safety with turns and backward gait sequencing Gait Pattern: Step-to pattern;Decreased step length - right;Decreased step length - left;Decreased stance time - left Gait velocity: decreased    Exercises Total Joint Exercises Ankle  Circles/Pumps: AROM;Both;10 reps;Seated Quad Sets: AROM;Both;10 reps;Seated Gluteal Sets: AROM;Both;10 reps;Seated Heel Slides: AAROM;Left;10 reps;Supine Hip ABduction/ADduction: AAROM;Left;10 reps;Supine Standing TE's 10 reps L hip flex, ABD, EXT and kick backs     PT Goals                                                           progressing    Visit Information  Last PT Received On: 04/23/12    Subjective Data  Subjective: I am ready to go home Patient Stated Goal: home   Cognition       Balance   good  End of Session PT - End of Session Equipment Utilized During Treatment: Gait belt Activity Tolerance: Patient tolerated treatment well Patient left: with call bell/phone within reach;with family/visitor present;Other (comment);in bed Nurse Communication: Mobility status   Felecia Shelling  PTA [facility]  Acute  Rehab Pager      [phone]

## 2012-04-23 NOTE — Progress Notes (Signed)
   Subjective: 2 Days Post-Op Procedure(s) (LRB): TOTAL HIP ARTHROPLASTY ANTERIOR APPROACH (Left) Patient reports pain as 2 on 0-10 scale.   Plan is to go Home after hospital stay.  Objective: Vital signs in last 24 hours: Temp:  [97.8 F (36.6 C)-98.7 F (37.1 C)] 97.8 F (36.6 C) (02/14 0515) Pulse Rate:  [78-94] 83 (02/14 0515) Resp:  [16] 16 (02/14 0515) BP: (121-160)/(68-92) 160/92 mmHg (02/14 0515) SpO2:  [90 %-99 %] 93 % (02/14 0515)  Intake/Output from previous day:  Intake/Output Summary (Last 24 hours) at 04/23/12 0901 Last data filed at 04/23/12 0732  Gross per 24 hour  Intake    840 ml  Output   1475 ml  Net   -635 ml    Intake/Output this shift: Total I/O In: 240 [P.O.:240] Out: -   Labs:  Recent Labs  04/22/12 0429 04/23/12 0415  HGB 11.4* 11.4*    Recent Labs  04/22/12 0429 04/23/12 0415  WBC 7.7 12.0*  RBC 3.51* 3.51*  HCT 34.2* 33.9*  PLT 179 182    Recent Labs  04/22/12 0429 04/23/12 0415  NA 135 137  K 3.8 3.7  CL 100 101  CO2 28 28  BUN 6 7  CREATININE 0.75 0.65  GLUCOSE 115* 150*  CALCIUM 8.4 9.0   No results found for this basename: LABPT, INR,  in the last 72 hours  EXAM General - Patient is Alert, Appropriate and Oriented Extremity - Neurologically intact Neurovascular intact Incision: dressing C/D/I No cellulitis present Compartment soft Dressing/Incision - clean, dry, no drainage Motor Function - intact, moving foot and toes well on exam.   Past Medical History  Diagnosis Date  . Hypertension   . Dysrhythmia     atrial fib   . Depression   . Shortness of breath     with exertion and associated with atrial fib   . GERD (gastroesophageal reflux disease)   . Arthritis     Assessment/Plan: 2 Days Post-Op Procedure(s) (LRB): TOTAL HIP ARTHROPLASTY ANTERIOR APPROACH (Left) Principal Problem:   OA (osteoarthritis) of hip   Up with therapy Discharge home with home health this afternoon  DVT Prophylaxis  - Xarelto Weight Bearing As Tolerated left Leg  Greg Huff V 04/23/2012, 9:01 AM

## 2012-09-27 ENCOUNTER — Ambulatory Visit: Payer: BC Managed Care – PPO | Admitting: Cardiology

## 2013-02-01 ENCOUNTER — Other Ambulatory Visit: Payer: Self-pay | Admitting: Cardiology

## 2013-04-09 ENCOUNTER — Other Ambulatory Visit: Payer: Self-pay | Admitting: Cardiology

## 2013-04-11 NOTE — Telephone Encounter (Signed)
Rx was sent to pharmacy electronically. 

## 2014-12-28 ENCOUNTER — Emergency Department (HOSPITAL_COMMUNITY): Payer: BC Managed Care – PPO

## 2014-12-28 ENCOUNTER — Encounter (HOSPITAL_COMMUNITY): Payer: Self-pay | Admitting: *Deleted

## 2014-12-28 ENCOUNTER — Emergency Department (HOSPITAL_COMMUNITY)
Admission: EM | Admit: 2014-12-28 | Discharge: 2014-12-28 | Disposition: A | Discharge status: Home / Self Care | Payer: BC Managed Care – PPO | Attending: Emergency Medicine | Admitting: Emergency Medicine

## 2014-12-28 DIAGNOSIS — T07XXXA Unspecified multiple injuries, initial encounter: Secondary | ICD-10-CM

## 2014-12-28 DIAGNOSIS — Z7982 Long term (current) use of aspirin: Secondary | ICD-10-CM | POA: Diagnosis not present

## 2014-12-28 DIAGNOSIS — I4891 Unspecified atrial fibrillation: Secondary | ICD-10-CM | POA: Diagnosis not present

## 2014-12-28 DIAGNOSIS — Z79899 Other long term (current) drug therapy: Secondary | ICD-10-CM | POA: Insufficient documentation

## 2014-12-28 DIAGNOSIS — Y9389 Activity, other specified: Secondary | ICD-10-CM | POA: Insufficient documentation

## 2014-12-28 DIAGNOSIS — K219 Gastro-esophageal reflux disease without esophagitis: Secondary | ICD-10-CM | POA: Insufficient documentation

## 2014-12-28 DIAGNOSIS — Z7901 Long term (current) use of anticoagulants: Secondary | ICD-10-CM | POA: Diagnosis not present

## 2014-12-28 DIAGNOSIS — S4992XA Unspecified injury of left shoulder and upper arm, initial encounter: Secondary | ICD-10-CM | POA: Diagnosis not present

## 2014-12-28 DIAGNOSIS — M1990 Unspecified osteoarthritis, unspecified site: Secondary | ICD-10-CM | POA: Insufficient documentation

## 2014-12-28 DIAGNOSIS — F329 Major depressive disorder, single episode, unspecified: Secondary | ICD-10-CM | POA: Diagnosis not present

## 2014-12-28 DIAGNOSIS — T148 Other injury of unspecified body region: Secondary | ICD-10-CM | POA: Diagnosis not present

## 2014-12-28 DIAGNOSIS — Y92828 Other wilderness area as the place of occurrence of the external cause: Secondary | ICD-10-CM | POA: Insufficient documentation

## 2014-12-28 DIAGNOSIS — S3992XA Unspecified injury of lower back, initial encounter: Secondary | ICD-10-CM | POA: Diagnosis present

## 2014-12-28 DIAGNOSIS — Y998 Other external cause status: Secondary | ICD-10-CM | POA: Insufficient documentation

## 2014-12-28 DIAGNOSIS — S8992XA Unspecified injury of left lower leg, initial encounter: Secondary | ICD-10-CM | POA: Diagnosis not present

## 2014-12-28 DIAGNOSIS — S79912A Unspecified injury of left hip, initial encounter: Secondary | ICD-10-CM | POA: Insufficient documentation

## 2014-12-28 DIAGNOSIS — I10 Essential (primary) hypertension: Secondary | ICD-10-CM | POA: Insufficient documentation

## 2014-12-28 MED ORDER — CYCLOBENZAPRINE HCL 10 MG PO TABS: 10.0000 mg | ORAL_TABLET | Freq: Three times a day (TID) | ORAL | Status: DC | PRN

## 2014-12-28 MED ORDER — TRAMADOL HCL 50 MG PO TABS: 50.0000 mg | ORAL_TABLET | Freq: Four times a day (QID) | ORAL | Status: AC | PRN

## 2014-12-28 MED ORDER — HYDROCODONE-ACETAMINOPHEN 5-325 MG PO TABS: 2.0000 | ORAL_TABLET | ORAL | Status: DC | PRN
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
## 2015-12-03 ENCOUNTER — Telehealth: Payer: Self-pay | Admitting: Cardiology

## 2015-12-03 NOTE — Telephone Encounter (Signed)
[facility] on 12/26/15 w/ Dr Herbie BaltimoreHarding. Records given to [employer] (medical records) CN

## 2015-12-26 ENCOUNTER — Ambulatory Visit: Payer: BC Managed Care – PPO | Admitting: Cardiology

## 2016-01-14 ENCOUNTER — Ambulatory Visit: Payer: BC Managed Care – PPO | Admitting: Cardiology

## 2016-01-23 ENCOUNTER — Encounter: Payer: Self-pay | Admitting: Cardiology

## 2016-01-23 ENCOUNTER — Ambulatory Visit (INDEPENDENT_AMBULATORY_CARE_PROVIDER_SITE_OTHER): Payer: BC Managed Care – PPO | Admitting: Cardiology

## 2016-01-23 VITALS — BP 144/81 | HR 72 | Ht 73.0 in | Wt 221.2 lb

## 2016-01-23 DIAGNOSIS — I4819 Other persistent atrial fibrillation: Secondary | ICD-10-CM | POA: Insufficient documentation

## 2016-01-23 DIAGNOSIS — I481 Persistent atrial fibrillation: Secondary | ICD-10-CM

## 2016-01-23 DIAGNOSIS — E785 Hyperlipidemia, unspecified: Secondary | ICD-10-CM

## 2016-01-23 DIAGNOSIS — R0602 Shortness of breath: Secondary | ICD-10-CM

## 2016-01-23 DIAGNOSIS — I10 Essential (primary) hypertension: Secondary | ICD-10-CM | POA: Insufficient documentation

## 2016-01-23 NOTE — Patient Instructions (Signed)
Schedule [address] Your physician has requested that you have an echocardiogram. Echocardiography is a painless test that uses sound waves to create images of your heart. It provides your doctor with information about the size and shape of your heart and how well your heart's chambers and valves are working. This procedure takes approximately one hour. There are no restrictions for this procedure.   Your physician recommends that you schedule a follow-up appointment in 3 months with Dr Herbie BaltimoreHarding.   If you need a refill on your cardiac medications before your next appointment, please call your pharmacy.

## 2016-01-23 NOTE — Progress Notes (Signed)
PCP: Londell Moh, MD  Clinic Note: Chief Complaint  Patient presents with  . New Patient (Initial Visit)    establishing care; A fib  . Shortness of Breath    Occasional    HPI: Greg Huff is a 65 y.o. male with a PMH below who presents today for cardiology evaluation with a history of A. Fib. I last saw him back in 2014. (By report it was July 2014 - unfortunately these records are not available.He was evaluated with an echocardiogram and a stress test at least 6 years ago)  Greg Huff was last seen on 11/20/2015 by Dr. Renne Crigler for routine health maintenance. He was doing relatively well at that time without any significant issues. There was concern for possible erectile dysfunction potentially related to hypotension so his lisinopril-HCTZ was discontinued.  Recent Hospitalizations: none  Studies Reviewed: No reports available  Interval History: [name] presents over 3 years visit to reestablish care with his history of atrial fibrillation. He has been doing fine over last several years without any major complaints. He says he occasionally has some episodes of shortness of breath recurrence of A. fib or not. He never really notes his heart rate going fast, but does note some occasional abnormal heart beating episodes. They do not seem to be very long lived however. These episodes can happen off and on whether he is sitting in bed or resting in the afternoon or even when he is active.With amount of activity he does, he denies any resting or exertional chest pain or dyspnea in the absence of the rare occasions when he has shortness of breath and irregular heartbeats. He has not had any dizziness wooziness syncope or near syncope. No PND, orthopnea or edema.  He indicates that he has lost weight and effort to control his prediabetes/diabetes as well as hypertension and dyslipidemia. He has been exercising consistently and watching his diet. He exercises just let every day 30  minutes on the treadmill and the other days of the week he goes to gym and does machines.   No TIA/amaurosis fugax symptoms. No bleeding issues from Xarelto: No melena, hematochezia, hematuria, epstaxis, or significant Bruising. No claudication.  PAD Screen 01/23/2016  Previous PAD dx? No  Previous surgical procedure? No  Pain with walking? No  Feet/toe relief with dangling? No  Painful, non-healing ulcers? No  Extremities discolored? No     ROS: A comprehensive was performed. Review of Systems  Constitutional: Negative for chills, fever and malaise/fatigue.  HENT: Positive for nosebleeds. Negative for congestion.   Respiratory: Positive for cough (Intermittent, not dramatic) and shortness of breath. Negative for sputum production and wheezing.   Cardiovascular: Negative for chest pain, palpitations, orthopnea, claudication, leg swelling and PND.  Gastrointestinal: Negative.  Negative for abdominal pain, blood in stool, diarrhea, heartburn and melena.  Genitourinary: Negative for dysuria, hematuria and urgency.  Musculoskeletal: Negative for falls and joint pain.  Skin: Negative.   Neurological: Negative for dizziness.  Endo/Heme/Allergies: Negative for environmental allergies.  Psychiatric/Behavioral: Negative for depression and memory loss. The patient is not nervous/anxious and does not have insomnia.   All other systems reviewed and are negative.  Past Medical History:  Diagnosis Date  . Anxiety   . Arthritis   . Asthmatic bronchitis, mild intermittent, uncomplicated   . Depression   . Dyslipidemia    On atorvastatin. Followed by PCP  . ED (erectile dysfunction)   . Elevated PSA   . Essential hypertension   . GERD (  gastroesophageal reflux disease)   . Persistent atrial fibrillation (HCC)    Rate controlled with diltiazem and anticoagulation with Xarelto  . Prediabetes   . Shortness of breath    with exertion and associated with atrial fib     Past Surgical  History:  Procedure Laterality Date  . HERNIA REPAIR     umbilical hernia repair - 2007  . TONSILLECTOMY    . TOTAL HIP ARTHROPLASTY Left 04/21/2012   Procedure: TOTAL HIP ARTHROPLASTY ANTERIOR APPROACH;  Surgeon: Loanne DrillingFrank V Aluisio, MD;  Location: [facility];  Service: Orthopedics;  Laterality: Left;    Current Meds  Medication Sig  . atorvastatin (LIPITOR) 20 MG tablet Take 20 mg by mouth every morning.   Marland Kitchen. DILT-XR 240 MG 24 hr capsule Take 240 mg by mouth daily.  . Multiple Vitamin (MULTIVITAMIN WITH MINERALS) TABS tablet Take 1 tablet by mouth daily.  Marland Kitchen. omeprazole (PRILOSEC) 10 MG capsule Take 10 mg by mouth daily.  . sertraline (ZOLOFT) 50 MG tablet Take 50 mg by mouth every morning.   . testosterone cypionate (DEPOTESTOSTERONE CYPIONATE) 200 MG/ML injection Inject 1.5 mLs into the muscle every 14 (fourteen) days.  . traMADol (ULTRAM) 50 MG tablet Take 1 tablet (50 mg total) by mouth every 6 (six) hours as needed.  . traZODone (DESYREL) 50 MG tablet Take 50 mg by mouth at bedtime.  . vitamin B-12 (CYANOCOBALAMIN) 1000 MCG tablet Take 1,000 mcg by mouth daily.  Carlena Hurl. XARELTO 20 MG TABS tablet Take 20 mg by mouth daily.  . [DISCONTINUED] doxylamine, Sleep, (UNISOM) 25 MG tablet Take 25-50 mg by mouth at bedtime as needed for sleep.  . [DISCONTINUED] ranitidine (ZANTAC) 150 MG tablet Take 150 mg by mouth daily.   No Known Allergies  Social History   Social History  . Marital status: Divorced    Spouse name: N/A  . Number of children: N/A  . Years of education: N/A   Social History Main Topics  . Smoking status: Never Smoker  . Smokeless tobacco: Never Used  . Alcohol use 6.0 oz/week    10 Cans of beer per week     Comment: beer and mixed drinks; 2-3 drinks per night  . Drug use: No  . Sexual activity: Not Asked   Other Topics Concern  . None   Social History Narrative   He works doing administration at [employer].   Family History  Problem Relation  Age of Onset  . Cerebral aneurysm Mother     Wt Readings from Last 3 Encounters:  01/23/16 100.3 kg (221 lb 3.2 oz)  04/21/12 100.7 kg (222 lb)  04/14/12 104.3 kg (230 lb)    PHYSICAL EXAM BP (!) 144/81   Pulse 72   Ht 6\' 1"  (1.854 m)   Wt 100.3 kg (221 lb 3.2 oz)   BMI 29.18 kg/m  General appearance: alert, cooperative, appears stated age, no distress and Borderline obese; normal mood and affect. Well-nourished well-groomed HEENT: [place]/AT, EOMI, MMM, anicteric sclera Neck: no adenopathy, no carotid bruit and no JVD Lungs: clear to auscultation bilaterally, normal percussion bilaterally and non-labored; no W/R/R Heart: Irreg-Irreg with normal rate, S1& S2 normal, no murmur, click, rub or gallop; non-displaced PMI Abdomen: soft, non-tender; bowel sounds normal; no masses,  no organomegaly; midline diastases recti Extremities: extremities normal, atraumatic, no cyanosis, oredema Pulses: 2+ and symmetric;  Skin: mobility and turgor normal, no edema, no evidence of bleeding or bruising and temperature normal  Neurologic: Mental status: Alert, oriented,  thought content appropriate Cranial nerves: normal (II-XII grossly intact)    Adult ECG Report  Rate: 72 ;  Rhythm: atrial fibrillation and With nonspecific ST and T-wave changes.; otherwise normal axis, intervals and durations.  Narrative Interpretation: Other than A. fib, relatively normal EKG.   Other studies Reviewed: Additional studies/ records that were reviewed today include:  Recent Labs:  Followed by PCP    ASSESSMENT / PLAN: Problem List Items Addressed This Visit    Persistent atrial fibrillation (HCC): CHA2DS2Vasc = 2, on Xarelto; Rate controlled  (Chronic)    He seems to be very well tolerating his A. fib and is rate controlled on current regimen with diltiazem and now recently added metoprolol. This patients CHA2DS2-VASc Score and unadjusted Ischemic Stroke Rate (% per year) is equal to 2.2 % stroke rate/year from a  score of 2  Above score calculated as 1 point each if present [CHF, HTN, DM, Vascular=MI/PAD/Aortic Plaque, Age if 4965-74, or Male]; 2 points each if present [Age > 75, or Stroke/TIA/TE]        Relevant Orders   EKG 12-Lead   ECHOCARDIOGRAM COMPLETE   Essential hypertension (Chronic)    Borderline control. Currently he just started metoprolol in addition to diltiazem. He does have heart rate room to increase, would simply monitor and defer to PCP. His overall his blood pressure has improved with weight loss.      Relevant Orders   EKG 12-Lead   ECHOCARDIOGRAM COMPLETE   Dyslipidemia (Chronic)    On atorvastatin. Followed by PCP.      Relevant Orders   EKG 12-Lead   ECHOCARDIOGRAM COMPLETE   Shortness of breath    Interestingly, he does have some exertional shortness of breath which may be related to A. fib, however he is not had any evaluation of his cardiac function in quite some time. Plan: Check 2-D echocardiogram to reassess EF along with potential abnormal wall motion.      Relevant Orders   EKG 12-Lead   ECHOCARDIOGRAM COMPLETE      Current medicines are reviewed at length with the patient today. (+/- concerns) none The following changes have been made:   Patient Instructions  Schedule [address] Your physician has requested that you have an echocardiogram. Echocardiography is a painless test that uses sound waves to create images of your heart. It provides your doctor with information about the size and shape of your heart and how well your heart's chambers and valves are working. This procedure takes approximately one hour. There are no restrictions for this procedure.   Your physician recommends that you schedule a follow-up appointment in 3 months with Dr Herbie BaltimoreHarding.   If you need a refill on your cardiac medications before your next appointment, please call your pharmacy.    Studies Ordered:   Orders Placed This Encounter    Procedures  . EKG 12-Lead  . ECHOCARDIOGRAM COMPLETE      Bryan Lemmaavid Harding, M.D., M.S. Interventional Cardiologist   Pager # [phone] Phone # [phone] 752 7156 [address] [place], [place] [address]

## 2016-01-25 ENCOUNTER — Encounter: Payer: Self-pay | Admitting: Cardiology

## 2016-01-25 NOTE — Assessment & Plan Note (Signed)
On atorvastatin. Followed by PCP.

## 2016-01-25 NOTE — Assessment & Plan Note (Signed)
He seems to be very well tolerating his A. fib and is rate controlled on current regimen with diltiazem and now recently added metoprolol. This patients CHA2DS2-VASc Score and unadjusted Ischemic Stroke Rate (% per year) is equal to 2.2 % stroke rate/year from a score of 2  Above score calculated as 1 point each if present [CHF, HTN, DM, Vascular=MI/PAD/Aortic Plaque, Age if 65-74, or Male]; 2 points each if present [Age > 75, or Stroke/TIA/TE]

## 2016-01-25 NOTE — Assessment & Plan Note (Signed)
Borderline control. Currently he just started metoprolol in addition to diltiazem. He does have heart rate room to increase, would simply monitor and defer to PCP. His overall his blood pressure has improved with weight loss.

## 2016-01-25 NOTE — Assessment & Plan Note (Signed)
Interestingly, he does have some exertional shortness of breath which may be related to A. fib, however he is not had any evaluation of his cardiac function in quite some time. Plan: Check 2-D echocardiogram to reassess EF along with potential abnormal wall motion.

## 2016-02-19 ENCOUNTER — Ambulatory Visit (HOSPITAL_COMMUNITY): Payer: BC Managed Care – PPO | Attending: Cardiology

## 2016-02-19 ENCOUNTER — Other Ambulatory Visit: Payer: Self-pay

## 2016-02-19 DIAGNOSIS — I481 Persistent atrial fibrillation: Secondary | ICD-10-CM | POA: Insufficient documentation

## 2016-02-19 DIAGNOSIS — E785 Hyperlipidemia, unspecified: Secondary | ICD-10-CM | POA: Diagnosis not present

## 2016-02-19 DIAGNOSIS — I340 Nonrheumatic mitral (valve) insufficiency: Secondary | ICD-10-CM | POA: Insufficient documentation

## 2016-02-19 DIAGNOSIS — I77810 Thoracic aortic ectasia: Secondary | ICD-10-CM | POA: Diagnosis not present

## 2016-02-19 DIAGNOSIS — R0602 Shortness of breath: Secondary | ICD-10-CM

## 2016-02-19 DIAGNOSIS — I119 Hypertensive heart disease without heart failure: Secondary | ICD-10-CM | POA: Insufficient documentation

## 2016-02-19 DIAGNOSIS — R0600 Dyspnea, unspecified: Secondary | ICD-10-CM | POA: Diagnosis present

## 2016-02-19 DIAGNOSIS — I10 Essential (primary) hypertension: Secondary | ICD-10-CM | POA: Diagnosis not present

## 2016-02-19 DIAGNOSIS — I4819 Other persistent atrial fibrillation: Secondary | ICD-10-CM

## 2016-02-19 DIAGNOSIS — I4891 Unspecified atrial fibrillation: Secondary | ICD-10-CM | POA: Diagnosis present

## 2016-02-28 ENCOUNTER — Telehealth: Payer: Self-pay | Admitting: Cardiology

## 2016-02-28 NOTE — Telephone Encounter (Signed)
Pt would like his echo results from 02-19-16 please.

## 2016-02-28 NOTE — Telephone Encounter (Signed)
Patient called with echo results
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
